# Patient Record
Sex: Male | Born: 1940 | Race: White | Hispanic: No | Marital: Married | Smoking: Former smoker
Health system: Southern US, Community
[De-identification: ages and names within clinical notes are randomized; demographics above are authoritative.]

## PROBLEM LIST (undated history)

## (undated) DIAGNOSIS — K219 Gastro-esophageal reflux disease without esophagitis: Secondary | ICD-10-CM

## (undated) DIAGNOSIS — I509 Heart failure, unspecified: Secondary | ICD-10-CM

## (undated) DIAGNOSIS — Z7901 Long term (current) use of anticoagulants: Secondary | ICD-10-CM

## (undated) DIAGNOSIS — I4891 Unspecified atrial fibrillation: Secondary | ICD-10-CM

## (undated) DIAGNOSIS — F32A Depression, unspecified: Secondary | ICD-10-CM

## (undated) DIAGNOSIS — F329 Major depressive disorder, single episode, unspecified: Secondary | ICD-10-CM

## (undated) DIAGNOSIS — R011 Cardiac murmur, unspecified: Secondary | ICD-10-CM

## (undated) DIAGNOSIS — I10 Essential (primary) hypertension: Secondary | ICD-10-CM

## (undated) HISTORY — PX: EYE SURGERY: SHX253

## (undated) HISTORY — PX: VASECTOMY: SHX75

## (undated) HISTORY — PX: CARDIAC DEFIBRILLATOR PLACEMENT: SHX171

## (undated) HISTORY — PX: INSERT / REPLACE / REMOVE PACEMAKER: SUR710

---

## 2006-01-26 ENCOUNTER — Ambulatory Visit: Payer: Self-pay | Admitting: Cardiology

## 2006-02-08 ENCOUNTER — Ambulatory Visit: Payer: Self-pay | Admitting: Ophthalmology

## 2006-02-15 ENCOUNTER — Ambulatory Visit: Payer: Self-pay | Admitting: Ophthalmology

## 2007-05-14 ENCOUNTER — Ambulatory Visit: Payer: Self-pay | Admitting: Unknown Physician Specialty

## 2009-02-06 ENCOUNTER — Encounter: Payer: Self-pay | Admitting: Urology

## 2009-02-23 ENCOUNTER — Encounter: Payer: Self-pay | Admitting: Urology

## 2009-11-13 ENCOUNTER — Ambulatory Visit: Payer: Self-pay | Admitting: Cardiology

## 2009-11-20 ENCOUNTER — Ambulatory Visit: Payer: Self-pay | Admitting: Cardiology

## 2011-01-21 ENCOUNTER — Ambulatory Visit: Payer: Self-pay | Admitting: Physician Assistant

## 2011-02-25 ENCOUNTER — Encounter (HOSPITAL_COMMUNITY): Payer: Self-pay | Admitting: Respiratory Therapy

## 2011-02-28 ENCOUNTER — Encounter (HOSPITAL_COMMUNITY): Payer: Self-pay

## 2011-02-28 ENCOUNTER — Ambulatory Visit (HOSPITAL_COMMUNITY)
Admission: RE | Admit: 2011-02-28 | Discharge: 2011-02-28 | Disposition: A | Discharge status: Home / Self Care | Payer: Medicare Other | Source: Ambulatory Visit | Attending: Anesthesiology | Admitting: Anesthesiology

## 2011-02-28 ENCOUNTER — Encounter (HOSPITAL_COMMUNITY)
Admission: RE | Admit: 2011-02-28 | Discharge: 2011-02-28 | Disposition: A | Discharge status: Home / Self Care | Payer: Medicare Other | Source: Ambulatory Visit | Attending: Neurosurgery | Admitting: Neurosurgery

## 2011-02-28 DIAGNOSIS — Z01812 Encounter for preprocedural laboratory examination: Secondary | ICD-10-CM | POA: Insufficient documentation

## 2011-02-28 DIAGNOSIS — Z01818 Encounter for other preprocedural examination: Secondary | ICD-10-CM | POA: Insufficient documentation

## 2011-02-28 DIAGNOSIS — Z950 Presence of cardiac pacemaker: Secondary | ICD-10-CM | POA: Insufficient documentation

## 2011-02-28 HISTORY — DX: Major depressive disorder, single episode, unspecified: F32.9

## 2011-02-28 HISTORY — DX: Gastro-esophageal reflux disease without esophagitis: K21.9

## 2011-02-28 HISTORY — DX: Essential (primary) hypertension: I10

## 2011-02-28 HISTORY — DX: Depression, unspecified: F32.A

## 2011-02-28 HISTORY — DX: Heart failure, unspecified: I50.9

## 2011-02-28 HISTORY — DX: Cardiac murmur, unspecified: R01.1

## 2011-02-28 LAB — CBC
HCT: 42.9 % (ref 39.0–52.0)
Hemoglobin: 14.4 g/dL (ref 13.0–17.0)
MCH: 32.7 pg (ref 26.0–34.0)
MCHC: 33.6 g/dL (ref 30.0–36.0)
MCV: 97.5 fL (ref 78.0–100.0)

## 2011-02-28 LAB — BASIC METABOLIC PANEL
BUN: 12 mg/dL (ref 6–23)
Calcium: 9.4 mg/dL (ref 8.4–10.5)
GFR calc non Af Amer: 84 mL/min — ABNORMAL LOW (ref >90)
Glucose, Bld: 103 mg/dL — ABNORMAL HIGH (ref 70–99)

## 2011-02-28 NOTE — Pre-Procedure Instructions (Signed)
18 Evan Sanchez  02/28/2011   Your procedure is scheduled on:  03-04-2011  Report to [facility] at 9:00 AM.  Call this number if you have problems the morning of surgery: [phone]   Remember:   Do not eat food:After Midnight.  Do not drink clear liquids: 4 Hours before arrival.  Take these medicines the morning of surgery with A SIP OF WATER: coreg, digoxin, celexa   Do not wear jewelry, make-up or nail polish.  Do not wear lotions, powders, or perfumes. You may wear deodorant.  Do not shave 48 hours prior to surgery.  Do not bring valuables to the hospital.  Contacts, dentures or bridgework may not be worn into surgery.  Leave suitcase in the car. After surgery it may be brought to your room.  For patients admitted to the hospital, checkout time is 11:00 AM the day of discharge.   Patients discharged the day of surgery will not be allowed to drive home.  Name and phone number of your driver: patricia fogleman - spouse  [phone]  Special Instructions: CHG Shower Use Special Wash: 1/2 bottle night before surgery and 1/2 bottle morning of surgery.   Please read over the following fact sheets that you were given: Pain Booklet, Coughing and Deep Breathing, MRSA Information and Surgical Site Infection Prevention

## 2011-03-03 MED ORDER — VANCOMYCIN HCL 500 MG IV SOLR
500.0000 mg | Freq: Once | INTRAVENOUS | Status: DC
  Filled 2011-03-03: qty 500

## 2011-03-03 MED ORDER — DEXAMETHASONE SODIUM PHOSPHATE 10 MG/ML IJ SOLN: 10.0000 mg | Freq: Once | INTRAMUSCULAR | Status: DC

## 2011-03-03 MED ORDER — TOBRAMYCIN SULFATE 80 MG/2ML IJ SOLN
80.0000 mg | Freq: Once | INTRAVENOUS | Status: DC
  Filled 2011-03-03: qty 2

## 2011-03-03 MED ORDER — DIPHENHYDRAMINE HCL 50 MG/ML IJ SOLN: 50.0000 mg | Freq: Once | INTRAMUSCULAR | Status: DC

## 2011-03-03 NOTE — Progress Notes (Addendum)
Pre op Chart reviewed. Will send to anesthesia to review cardiac history with pacemaker and ICD. ICD form refaxed to [facility] and requested that it be sent back ASAP. Awaiting return call from [facility] to determine date that faxed EKG DOS cardiology does not have one on file within the last year. Repeat PT/ INR order in computer DOS d/t elevation per anesthesia standing orders. Spoke with Waldo Laine at [place] [facility] reports MD did not sign original pacemaker/ ICD order will refax after MD signs order.

## 2011-03-03 NOTE — Consult Note (Signed)
Anesthesia:  Patient is a 70 year old male for lumbar microdiskectomy on 03/04/11.  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  His Cardiologist is Dr. Darrold Junker who did feel patient was low cardiac risk for this procedure.  He had a negative ETT in 2007.  Echocardiogram from 10/07/09 showed mod-sev LV dysfunction, EF 25-30%, mod LV enlargement, mild-mod biatrial enlargement, mild MI, mild-mod TI.  These results are on the chart for the Anesthesiologist to review.  The patient will be getting an EKG DOS, as well as repeat PT/INR.  Dr. Darrold Junker has completed the perioperative device form.

## 2011-03-04 ENCOUNTER — Inpatient Hospital Stay (HOSPITAL_COMMUNITY)
Admission: RE | Admit: 2011-03-04 | Discharge: 2011-03-05 | DRG: 491 | Disposition: A | Discharge status: Home / Self Care | Payer: Medicare Other | Source: Ambulatory Visit | Attending: Neurosurgery | Admitting: Neurosurgery

## 2011-03-04 ENCOUNTER — Encounter (HOSPITAL_COMMUNITY): Payer: Self-pay | Admitting: Vascular Surgery

## 2011-03-04 ENCOUNTER — Other Ambulatory Visit: Payer: Self-pay

## 2011-03-04 ENCOUNTER — Inpatient Hospital Stay (HOSPITAL_COMMUNITY): Payer: Medicare Other | Admitting: Vascular Surgery

## 2011-03-04 ENCOUNTER — Inpatient Hospital Stay (HOSPITAL_COMMUNITY): Payer: Medicare Other

## 2011-03-04 ENCOUNTER — Encounter (HOSPITAL_COMMUNITY)
Admission: RE | Disposition: A | Discharge status: Home / Self Care | Payer: Self-pay | Source: Ambulatory Visit | Attending: Neurosurgery

## 2011-03-04 ENCOUNTER — Encounter (HOSPITAL_COMMUNITY): Payer: Self-pay | Admitting: Certified Registered"

## 2011-03-04 DIAGNOSIS — Z79899 Other long term (current) drug therapy: Secondary | ICD-10-CM

## 2011-03-04 DIAGNOSIS — Z9849 Cataract extraction status, unspecified eye: Secondary | ICD-10-CM

## 2011-03-04 DIAGNOSIS — K219 Gastro-esophageal reflux disease without esophagitis: Secondary | ICD-10-CM | POA: Diagnosis present

## 2011-03-04 DIAGNOSIS — Z7982 Long term (current) use of aspirin: Secondary | ICD-10-CM

## 2011-03-04 DIAGNOSIS — F3289 Other specified depressive episodes: Secondary | ICD-10-CM | POA: Diagnosis present

## 2011-03-04 DIAGNOSIS — Z7901 Long term (current) use of anticoagulants: Secondary | ICD-10-CM

## 2011-03-04 DIAGNOSIS — I10 Essential (primary) hypertension: Secondary | ICD-10-CM | POA: Diagnosis present

## 2011-03-04 DIAGNOSIS — I509 Heart failure, unspecified: Secondary | ICD-10-CM | POA: Diagnosis present

## 2011-03-04 DIAGNOSIS — M5126 Other intervertebral disc displacement, lumbar region: Principal | ICD-10-CM | POA: Diagnosis present

## 2011-03-04 DIAGNOSIS — F17200 Nicotine dependence, unspecified, uncomplicated: Secondary | ICD-10-CM | POA: Diagnosis present

## 2011-03-04 DIAGNOSIS — F329 Major depressive disorder, single episode, unspecified: Secondary | ICD-10-CM | POA: Diagnosis present

## 2011-03-04 HISTORY — PX: LUMBAR LAMINECTOMY/DECOMPRESSION MICRODISCECTOMY: SHX5026

## 2011-03-04 LAB — PROTIME-INR: Prothrombin Time: 14.8 seconds (ref 11.6–15.2)

## 2011-03-04 SURGERY — LUMBAR LAMINECTOMY/DECOMPRESSION MICRODISCECTOMY
Anesthesia: General | Site: Back | Laterality: Left | Wound class: Clean

## 2011-03-04 MED ORDER — PROPOFOL 10 MG/ML IV EMUL
INTRAVENOUS | Status: DC | PRN
  Administered 2011-03-04: 200 mg via INTRAVENOUS

## 2011-03-04 MED ORDER — NEOSTIGMINE METHYLSULFATE 1 MG/ML IJ SOLN
INTRAMUSCULAR | Status: DC | PRN
  Administered 2011-03-04: 4 mg via INTRAVENOUS

## 2011-03-04 MED ORDER — DEXAMETHASONE SODIUM PHOSPHATE 4 MG/ML IJ SOLN
4.0000 mg | Freq: Four times a day (QID) | INTRAMUSCULAR | Status: AC
  Administered 2011-03-05: 4 mg via INTRAVENOUS
  Filled 2011-03-04: qty 1

## 2011-03-04 MED ORDER — ACETAMINOPHEN 650 MG RE SUPP: 650.0000 mg | RECTAL | Status: DC | PRN

## 2011-03-04 MED ORDER — SODIUM CHLORIDE 0.9 % IR SOLN
Status: DC | PRN
  Administered 2011-03-04: 1000 mL

## 2011-03-04 MED ORDER — DIGOXIN 125 MCG PO TABS
125.0000 ug | ORAL_TABLET | Freq: Every day | ORAL | Status: DC
  Administered 2011-03-05: 125 ug via ORAL
  Filled 2011-03-04 (×2): qty 1

## 2011-03-04 MED ORDER — GLYCOPYRROLATE 0.2 MG/ML IJ SOLN
INTRAMUSCULAR | Status: DC | PRN
  Administered 2011-03-04: .4 mg via INTRAVENOUS

## 2011-03-04 MED ORDER — HYDROMORPHONE HCL PF 1 MG/ML IJ SOLN: 0.2500 mg | INTRAMUSCULAR | Status: DC | PRN

## 2011-03-04 MED ORDER — SIMVASTATIN 40 MG PO TABS
40.0000 mg | ORAL_TABLET | Freq: Every day | ORAL | Status: DC
  Administered 2011-03-04: 40 mg via ORAL
  Filled 2011-03-04 (×2): qty 1

## 2011-03-04 MED ORDER — PHENOL 1.4 % MT LIQD: 1.0000 | OROMUCOSAL | Status: DC | PRN

## 2011-03-04 MED ORDER — BUPIVACAINE HCL (PF) 0.25 % IJ SOLN
INTRAMUSCULAR | Status: DC | PRN
  Administered 2011-03-04: 20 mL

## 2011-03-04 MED ORDER — SODIUM CHLORIDE 0.9 % IJ SOLN: 3.0000 mL | INTRAMUSCULAR | Status: DC | PRN

## 2011-03-04 MED ORDER — FENTANYL CITRATE 0.05 MG/ML IJ SOLN
INTRAMUSCULAR | Status: DC | PRN
  Administered 2011-03-04: 50 ug via INTRAVENOUS
  Administered 2011-03-04: 150 ug via INTRAVENOUS
  Administered 2011-03-04 (×2): 50 ug via INTRAVENOUS

## 2011-03-04 MED ORDER — KCL IN DEXTROSE-NACL 20-5-0.45 MEQ/L-%-% IV SOLN
80.0000 mL/h | INTRAVENOUS | Status: DC
  Filled 2011-03-04 (×4): qty 1000

## 2011-03-04 MED ORDER — HYDROCODONE-ACETAMINOPHEN 5-325 MG PO TABS: 1.0000 | ORAL_TABLET | ORAL | Status: DC | PRN

## 2011-03-04 MED ORDER — CYCLOBENZAPRINE HCL 10 MG PO TABS: 10.0000 mg | ORAL_TABLET | Freq: Three times a day (TID) | ORAL | Status: DC | PRN

## 2011-03-04 MED ORDER — HYDROMORPHONE HCL PF 1 MG/ML IJ SOLN: 1.0000 mg | INTRAMUSCULAR | Status: DC | PRN

## 2011-03-04 MED ORDER — ASPIRIN EC 81 MG PO TBEC
81.0000 mg | DELAYED_RELEASE_TABLET | Freq: Every day | ORAL | Status: DC
  Administered 2011-03-04 – 2011-03-05 (×2): 81 mg via ORAL
  Filled 2011-03-04 (×2): qty 1

## 2011-03-04 MED ORDER — GABAPENTIN 300 MG PO CAPS
300.0000 mg | ORAL_CAPSULE | Freq: Every day | ORAL | Status: DC
  Administered 2011-03-04: 300 mg via ORAL
  Filled 2011-03-04 (×2): qty 1

## 2011-03-04 MED ORDER — ACETAMINOPHEN 325 MG PO TABS: 650.0000 mg | ORAL_TABLET | ORAL | Status: DC | PRN

## 2011-03-04 MED ORDER — CITALOPRAM HYDROBROMIDE 20 MG PO TABS
20.0000 mg | ORAL_TABLET | Freq: Every day | ORAL | Status: DC
  Administered 2011-03-04 – 2011-03-05 (×2): 20 mg via ORAL
  Filled 2011-03-04 (×2): qty 1

## 2011-03-04 MED ORDER — DOCUSATE SODIUM 100 MG PO CAPS
100.0000 mg | ORAL_CAPSULE | Freq: Two times a day (BID) | ORAL | Status: DC
  Administered 2011-03-04 – 2011-03-05 (×2): 100 mg via ORAL
  Filled 2011-03-04 (×2): qty 1

## 2011-03-04 MED ORDER — THROMBIN 5000 UNITS EX KIT
PACK | CUTANEOUS | Status: DC | PRN
  Administered 2011-03-04 (×2): 5000 [IU] via TOPICAL

## 2011-03-04 MED ORDER — DEXAMETHASONE SODIUM PHOSPHATE 10 MG/ML IJ SOLN
INTRAMUSCULAR | Status: DC | PRN
  Administered 2011-03-04: 10 mg via INTRAVENOUS

## 2011-03-04 MED ORDER — HEMOSTATIC AGENTS (NO CHARGE) OPTIME
TOPICAL | Status: DC | PRN
  Administered 2011-03-04: 1 via TOPICAL

## 2011-03-04 MED ORDER — LACTATED RINGERS IV SOLN
INTRAVENOUS | Status: DC | PRN
  Administered 2011-03-04 (×3): via INTRAVENOUS

## 2011-03-04 MED ORDER — MENTHOL 3 MG MT LOZG: 1.0000 | LOZENGE | OROMUCOSAL | Status: DC | PRN

## 2011-03-04 MED ORDER — VANCOMYCIN HCL IN DEXTROSE 1-5 GM/200ML-% IV SOLN
1000.0000 mg | Freq: Once | INTRAVENOUS | Status: AC
  Administered 2011-03-04: 1000 mg via INTRAVENOUS
  Filled 2011-03-04: qty 200

## 2011-03-04 MED ORDER — SODIUM CHLORIDE 0.9 % IV SOLN: 250.0000 mL | INTRAVENOUS | Status: DC

## 2011-03-04 MED ORDER — DEXAMETHASONE 4 MG PO TABS
4.0000 mg | ORAL_TABLET | Freq: Four times a day (QID) | ORAL | Status: AC
  Administered 2011-03-04: 4 mg via ORAL
  Filled 2011-03-04: qty 1

## 2011-03-04 MED ORDER — LISINOPRIL 5 MG PO TABS
5.0000 mg | ORAL_TABLET | Freq: Every day | ORAL | Status: DC
  Administered 2011-03-04 – 2011-03-05 (×2): 5 mg via ORAL
  Filled 2011-03-04 (×2): qty 1

## 2011-03-04 MED ORDER — ONDANSETRON HCL 4 MG/2ML IJ SOLN: 4.0000 mg | Freq: Once | INTRAMUSCULAR | Status: DC | PRN

## 2011-03-04 MED ORDER — ONDANSETRON HCL 4 MG/2ML IJ SOLN
INTRAMUSCULAR | Status: DC | PRN
  Administered 2011-03-04: 4 mg via INTRAVENOUS

## 2011-03-04 MED ORDER — CARVEDILOL 25 MG PO TABS
25.0000 mg | ORAL_TABLET | Freq: Two times a day (BID) | ORAL | Status: DC
  Administered 2011-03-04 – 2011-03-05 (×2): 25 mg via ORAL
  Filled 2011-03-04 (×4): qty 1

## 2011-03-04 MED ORDER — ROCURONIUM BROMIDE 100 MG/10ML IV SOLN
INTRAVENOUS | Status: DC | PRN
  Administered 2011-03-04: 50 mg via INTRAVENOUS
  Administered 2011-03-04: 10 mg via INTRAVENOUS

## 2011-03-04 MED ORDER — ZOLPIDEM TARTRATE 5 MG PO TABS: 5.0000 mg | ORAL_TABLET | Freq: Every evening | ORAL | Status: DC | PRN

## 2011-03-04 MED ORDER — ONDANSETRON HCL 4 MG/2ML IJ SOLN: 4.0000 mg | INTRAMUSCULAR | Status: DC | PRN

## 2011-03-04 MED ORDER — DIPHENHYDRAMINE HCL 50 MG/ML IJ SOLN
INTRAMUSCULAR | Status: DC | PRN
  Administered 2011-03-04: 50 mg via INTRAVENOUS

## 2011-03-04 MED ORDER — FUROSEMIDE 20 MG PO TABS
20.0000 mg | ORAL_TABLET | Freq: Every day | ORAL | Status: DC
  Administered 2011-03-05: 20 mg via ORAL
  Filled 2011-03-04: qty 1

## 2011-03-04 MED ORDER — KETOROLAC TROMETHAMINE 15 MG/ML IJ SOLN
INTRAMUSCULAR | Status: DC | PRN
  Administered 2011-03-04: 15 mg via INTRAVENOUS

## 2011-03-04 MED ORDER — KETOROLAC TROMETHAMINE 30 MG/ML IJ SOLN
30.0000 mg | Freq: Four times a day (QID) | INTRAMUSCULAR | Status: AC
  Administered 2011-03-04 – 2011-03-05 (×2): 30 mg via INTRAVENOUS
  Filled 2011-03-04 (×2): qty 1

## 2011-03-04 MED ORDER — BACITRACIN ZINC 500 UNIT/GM EX OINT
TOPICAL_OINTMENT | CUTANEOUS | Status: DC | PRN
  Administered 2011-03-04: 1 via TOPICAL

## 2011-03-04 MED ORDER — SODIUM CHLORIDE 0.9 % IR SOLN
Status: DC | PRN
  Administered 2011-03-04: 11:00:00

## 2011-03-04 SURGICAL SUPPLY — 43 items
7.0 ROUND ×2 IMPLANT
BAG DECANTER FOR FLEXI CONT (MISCELLANEOUS) ×2 IMPLANT
BENZOIN TINCTURE PRP APPL 2/3 (GAUZE/BANDAGES/DRESSINGS) ×2 IMPLANT
BLADE SURG ROTATE 9660 (MISCELLANEOUS) ×2 IMPLANT
BRUSH SCRUB EZ PLAIN DRY (MISCELLANEOUS) ×2 IMPLANT
BUR MATCHSTICK NEURO 3.0X3.8 (BURR) ×2 IMPLANT
CANISTER SUCTION 2500CC (MISCELLANEOUS) ×2 IMPLANT
CLOTH BEACON ORANGE TIMEOUT ST (SAFETY) ×2 IMPLANT
CONT SPEC 4OZ CLIKSEAL STRL BL (MISCELLANEOUS) ×2 IMPLANT
DRAPE LAPAROTOMY 100X72X124 (DRAPES) ×2 IMPLANT
DRAPE MICROSCOPE ZEISS OPMI (DRAPES) ×2 IMPLANT
DRAPE POUCH INSTRU U-SHP 10X18 (DRAPES) ×2 IMPLANT
DRAPE SURG IRRIG POUCH 19X23 (DRAPES) ×4 IMPLANT
DRESSING TELFA 8X3 (GAUZE/BANDAGES/DRESSINGS) ×2 IMPLANT
ELECT REM PT RETURN 9FT ADLT (ELECTROSURGICAL) ×2
ELECTRODE REM PT RTRN 9FT ADLT (ELECTROSURGICAL) ×1 IMPLANT
GAUZE SPONGE 4X4 16PLY XRAY LF (GAUZE/BANDAGES/DRESSINGS) ×2 IMPLANT
GLOVE BIOGEL PI IND STRL 6.5 (GLOVE) ×1 IMPLANT
GLOVE BIOGEL PI INDICATOR 6.5 (GLOVE) ×1
GLOVE ECLIPSE 7.5 STRL STRAW (GLOVE) ×2 IMPLANT
GLOVE ECLIPSE 8.5 STRL (GLOVE) ×2 IMPLANT
GLOVE INDICATOR 6.5 STRL GRN (GLOVE) ×2 IMPLANT
GLOVE OPTIFIT SS 6.5 STRL BRWN (GLOVE) ×2 IMPLANT
GOWN BRE IMP SLV AUR LG STRL (GOWN DISPOSABLE) ×2 IMPLANT
GOWN BRE IMP SLV AUR XL STRL (GOWN DISPOSABLE) ×2 IMPLANT
KIT BASIN OR (CUSTOM PROCEDURE TRAY) ×2 IMPLANT
KIT ROOM TURNOVER OR (KITS) ×2 IMPLANT
NEEDLE HYPO 22GX1.5 SAFETY (NEEDLE) ×2 IMPLANT
NEEDLE SPNL 22GX3.5 QUINCKE BK (NEEDLE) ×2 IMPLANT
NS IRRIG 1000ML POUR BTL (IV SOLUTION) ×2 IMPLANT
PACK LAMINECTOMY NEURO (CUSTOM PROCEDURE TRAY) ×2 IMPLANT
PAD ARMBOARD 7.5X6 YLW CONV (MISCELLANEOUS) ×6 IMPLANT
PATTIES SURGICAL .75X.75 (GAUZE/BANDAGES/DRESSINGS) ×2 IMPLANT
RUBBERBAND STERILE (MISCELLANEOUS) ×4 IMPLANT
SPONGE GAUZE 4X4 12PLY (GAUZE/BANDAGES/DRESSINGS) ×2 IMPLANT
SPONGE SURGIFOAM ABS GEL SZ50 (HEMOSTASIS) ×2 IMPLANT
SUT VIC AB 2-0 OS6 18 (SUTURE) ×6 IMPLANT
SUT VIC AB 3-0 CP2 18 (SUTURE) ×2 IMPLANT
SYR 20ML ECCENTRIC (SYRINGE) ×2 IMPLANT
TOOL FLUTED BALL 7MM (MISCELLANEOUS) IMPLANT
TOWEL OR 17X24 6PK STRL BLUE (TOWEL DISPOSABLE) ×2 IMPLANT
TOWEL OR 17X26 10 PK STRL BLUE (TOWEL DISPOSABLE) ×2 IMPLANT
WATER STERILE IRR 1000ML POUR (IV SOLUTION) ×2 IMPLANT

## 2011-03-04 NOTE — Progress Notes (Signed)
03/04/2011- Call to [employer]-spoke /w Eilene Ghazi- asked to [employer] rep. call [facility]

## 2011-03-04 NOTE — Brief Op Note (Signed)
03/04/2011  12:34 PM  PATIENT:  Evan Sanchez  70 y.o. male  PRE-OPERATIVE DIAGNOSIS:  Herniated Nucleus Pulposus  POST-OPERATIVE DIAGNOSIS:  Herniated Nucleus Pulposus lumbar four-five  PROCEDURE:  Procedure(s): LUMBAR LAMINECTOMY/DECOMPRESSION MICRODISCECTOMY  SURGEON:  Surgeon(s): Wael Maestas O Jeanenne Licea [employer]  PHYSICIAN ASSISTANT:   ASSISTANTS: Pool   ANESTHESIA:   general  EBL:  Total I/O In: 2000 [I.V.:2000] Out: -   BLOOD ADMINISTERED:none  DRAINS: none   LOCAL MEDICATIONS USED:  MARCAINE 20CC  SPECIMEN:  No Specimen  DISPOSITION OF SPECIMEN:  N/A  COUNTS:  YES  TOURNIQUET:  * No tourniquets in log *  DICTATION: .Dragon Dictation  PLAN OF CARE: Admit to inpatient   PATIENT DISPOSITION:  PACU - hemodynamically stable.   Delay start of Pharmacological VTE agent (>24hrs) due to surgical blood loss or risk of bleeding:  {YES/NO/NOT APPLICABLE:20182

## 2011-03-04 NOTE — Anesthesia Postprocedure Evaluation (Signed)
  Anesthesia Post-op Note  Patient: Evan Sanchez  Procedure(s) Performed:  LUMBAR LAMINECTOMY/DECOMPRESSION MICRODISCECTOMY - LEFT Lumbar four-five LUMBAR MICRODISKECTOMY  Patient Location: PACU  Anesthesia Type: General  Level of Consciousness: awake, alert  and oriented  Airway and Oxygen Therapy: Patient Spontanous Breathing and Patient connected to nasal cannula oxygen  Post-op Pain: mild  Post-op Assessment: Post-op Vital signs reviewed, Patient's Cardiovascular Status Stable, Respiratory Function Stable, Patent Airway, No signs of Nausea or vomiting and Pain level controlled  Post-op Vital Signs: stable  Complications: No apparent anesthesia complications

## 2011-03-04 NOTE — Anesthesia Procedure Notes (Signed)
Procedure Name: Intubation Date/Time: 03/04/2011 11:27 AM Performed by: Ellin Goodie Pre-anesthesia Checklist: Patient identified, Emergency Drugs available, Suction available, Patient being monitored and Timeout performed Patient Re-evaluated:Patient Re-evaluated prior to inductionOxygen Delivery Method: Circle System Utilized Preoxygenation: Pre-oxygenation with 100% oxygen Intubation Type: IV induction Ventilation: Mask ventilation without difficulty Laryngoscope Size: Mac and 3 Grade View: Grade I Tube type: Oral Tube size: 7.5 mm Number of attempts: 1 Airway Equipment and Method: stylet Placement Confirmation: ETT inserted through vocal cords under direct vision,  positive ETCO2,  CO2 detector and breath sounds checked- equal and bilateral Secured at: 23 cm Tube secured with: Tape Dental Injury: Teeth and Oropharynx as per pre-operative assessment  Comments: LTA utilized for intubations

## 2011-03-04 NOTE — Op Note (Signed)
Preop diagnosis: Herniated disc L4-5 left Postop diagnosis: Same Procedure: Left L4-5 intralaminar laminotomy for excision of herniated disc with operating microscope Surgeon: Dr.Mearl Olver Assistnat: Dr Jordan Likes  After place the prone position the patient's back was prepped and draped in the usual sterile fashion. Localizing x-ray was taken prior to incision to identify the appropriate level. Midline incision was made about the spinous processes of L4 and L5. Using Bovie cutting current the incision was carried down the spinous processes. Subperiosteal dissection was then carried out on the on the left side of the spinous processes and lamina and self-retaining retractor was placed for exposure. Second x-ray showed approach to the appropriate level. Using the high-speed drill the inferior one half of the L4 lamina and the medial one third of the facet joint were removed. Drill was then used to remove the superior one third of the L5 lamina. The bone and ligamentum flavum removed in a piecemeal fashion. Microscope is draped and brought into the field and used for the remainder of the case. Using microsecond technique the lateral aspect of the thecal sac was identified. Further coagulation was carried out down before the canal to identify the L4-5 disc which was found to be tremendously herniated. After coagulating on the annulus the annulus was incised with a 15 blade. Using pituitary rongeurs and curettes a thorough displaced cleanout was carried out while the same time great care was taken to avoid injury to the neural elements. This was successfully done. Inferior disc material beneath the L5 nerve root was removed as well. At this time inspection was carried out in all directions the evidence of residual compression and none could be identified. Large amounts of irrigation carried out any bleeding was controlled with coagulation and Gelfoam. The wound was then closed in multiple layers of Vicryl on the muscle  fascia subcutaneous and subcuticular tissues. Staples were placed on the skin. A sterile dressing was then applied and the patient was extubated and taken to recovery in stable condition.

## 2011-03-04 NOTE — Anesthesia Preprocedure Evaluation (Addendum)
Anesthesia Evaluation  Patient identified by MRN, date of birth, ID band Patient awake    Reviewed: Allergy & Precautions, H&P , NPO status , Patient's Chart, lab work & pertinent test results, reviewed documented beta blocker date and time   Airway Mallampati: I TM Distance: >3 FB Neck ROM: full    Dental  (+) Missing and Partial Upper   Pulmonary neg pulmonary ROS,    Pulmonary exam normal       Cardiovascular Exercise Tolerance: Good hypertension, Pt. on medications and Pt. on home beta blockers +CHF + Cardiac Defibrillator + Valvular Problems/Murmurs regular Normal    Neuro/Psych PSYCHIATRIC DISORDERS (pt denies) Negative Neurological ROS     GI/Hepatic GERD-  Controlled,  Endo/Other  Negative Endocrine ROS  Renal/GU negative Renal ROS  Genitourinary negative   Musculoskeletal   Abdominal   Peds  Hematology   Anesthesia Other Findings   Reproductive/Obstetrics                          Anesthesia Physical Anesthesia Plan  ASA: III  Anesthesia Plan: General   Post-op Pain Management:    Induction: Intravenous  Airway Management Planned: Oral ETT  Additional Equipment:   Intra-op Plan:   Post-operative Plan: Extubation in OR  Informed Consent: I have reviewed the patients History and Physical, chart, labs and discussed the procedure including the risks, benefits and alternatives for the proposed anesthesia with the patient or authorized representative who has indicated his/her understanding and acceptance.   Dental advisory given  Plan Discussed with: CRNA, Anesthesiologist and Surgeon  Anesthesia Plan Comments:         Anesthesia Quick Evaluation

## 2011-03-04 NOTE — Progress Notes (Signed)
03/04/2011, call back from [place], rep. /w [employer], reported pt. & surgery event to take place today.

## 2011-03-04 NOTE — Progress Notes (Addendum)
PT'S AICD HAS BEEN INTEROGATED AND SENT TO [employer] VIA [facility] (PHONE).  1328: [employer] REP (TARA SHANNON) CALLED AND STATES RESULTS FOR HERE ARE WNL AND THAT SHE WILL ALSO FAX TO HIS CARDIOLOGIST.

## 2011-03-04 NOTE — H&P (Signed)
Evan Sanchez is an 70 y.o. male.   Chief Complaint: Left leg pain HPI: Mr. Ground is a 70 year old gentleman with left lower extremity pain. He was shot right conservative therapies without improvement. He underwent imaging studies which showed a disc abnormality at L5-S1 on the left. After discussing the options he requested surgical intervention. He is now brought to the hospital for an L5-S1 microdiscectomy on the left.  Past Medical History  Diagnosis Date  . Heart murmur     as a child  . CHF (congestive heart failure)      put on coumadin after had chf 3-4 yrs ago  . GERD (gastroesophageal reflux disease)     h/o acid reflux  . Depression   . Hypertension     takes lisinopril - requested ekg, echo, stress tes t from [facility] clinic    Past Surgical History  Procedure Date  . Eye surgery     right cataract removal  . Vasectomy   . Cardiac defibrillator placement ? 2010    No family history on file. Social History:  reports that he quit smoking about 35 years ago. His smoking use included Cigarettes. He has a 50 pack-year smoking history. He has never used smokeless tobacco. He reports that he drinks about 2.4 ounces of alcohol per week. He reports that he does not use illicit drugs.  Allergies: No Known Allergies  Medications Prior to Admission  Medication Dose Route Frequency Provider Last Rate Last Dose  . dexamethasone (DECADRON) injection 10 mg  10 mg Intravenous Once [employer]      . diphenhydrAMINE (BENADRYL) injection 50 mg  50 mg Intravenous Once [employer]      . tobramycin (NEBCIN) 80 mg in dextrose 5 % 50 mL IVPB  80 mg Intravenous Once [employer]      . vancomycin (VANCOCIN) 500 mg in sodium chloride 0.9 % 100 mL IVPB  500 mg Intravenous Once Rolanda Lundborg Kathline Banbury       Medications Prior to Admission  Medication Sig Dispense Refill  . aspirin EC 81 MG tablet Take 81 mg by mouth daily.        . carvedilol (COREG) 25 MG tablet Take 25 mg by  mouth 2 (two) times daily with a meal.        . citalopram (CELEXA) 20 MG tablet Take 20 mg by mouth daily.        . digoxin (LANOXIN) 0.125 MG tablet Take 125 mcg by mouth daily.        . furosemide (LASIX) 20 MG tablet Take 20 mg by mouth daily.        Marland Kitchen gabapentin (NEURONTIN) 300 MG capsule Take 300 mg by mouth at bedtime.        Marland Kitchen HYDROcodone-acetaminophen (NORCO) 5-325 MG per tablet Take 1 tablet by mouth every 6 (six) hours as needed. For pain        . lisinopril (PRINIVIL,ZESTRIL) 5 MG tablet Take 5 mg by mouth daily.        . Multiple Vitamins-Minerals (MULTIVITAMINS THER. W/MINERALS) TABS Take 1 tablet by mouth daily.        . Omega-3 Fatty Acids (FISH OIL) 1200 MG CAPS Take 1,200 mg by mouth daily.        . simvastatin (ZOCOR) 40 MG tablet Take 40 mg by mouth at bedtime.        Marland Kitchen warfarin (COUMADIN) 3 MG tablet Take 3 mg by mouth daily.  No results found for this or any previous visit (from the past 48 hour(s)). No results found.  Pertinent items are noted in HPI.  Blood pressure 132/82, pulse 88, temperature 98.4 F (36.9 C), temperature source Oral, resp. rate 18, SpO2 98.00%.  Neurologic: Reflexes: 2+ and symmetric Achilles reflex (L-5 to S-2) left 0/4 Assessment/Plan Assessment is a herniated disc at L5-S1 on the left. Plan is for a left L5-S1 microdiscectomy.  Reinaldo Meeker, MD 03/04/2011, 9:53 AM

## 2011-03-04 NOTE — Transfer of Care (Signed)
Immediate Anesthesia Transfer of Care Note  Patient: Evan Sanchez  Procedure(s) Performed:  LUMBAR LAMINECTOMY/DECOMPRESSION MICRODISCECTOMY - LEFT Lumbar four-five LUMBAR MICRODISKECTOMY  Patient Location: PACU  Anesthesia Type: General  Level of Consciousness: awake  Airway & Oxygen Therapy: Patient Spontanous Breathing  Post-op Assessment: Report given to PACU RN  Post vital signs: stable  Complications: No apparent anesthesia complications

## 2011-03-04 NOTE — Progress Notes (Signed)
Pharmacy - Vancomycin  1 dose of vancomycin given post-op - 1 Gram IV x 1 at 8 pm  Thank you.

## 2011-03-05 NOTE — Discharge Summary (Addendum)
  Physician Discharge Summary  Patient ID: Evan Sanchez MRN: [id number] DOB/AGE: January 29, 1941 70 y.o.  Admit date: 03/04/2011 Discharge date: 03/05/2011  Admission Diagnoses:  Discharge Diagnoses:  Active Problems:  * No active hospital problems. *    Discharged Condition: good  Hospital Course: The patient was admitted on 03/04/2011 and taken to the operating room where the patient underwent laminectomy. The patient tolerated the procedure well and was taken to the recovery room and then to the floor in stable condition. The hospital course was routine. There were no complications. The wound remained clean dry and intact. The patient remained afebrile with stable vital signs, and tolerated a regular diet. The patient continued to increase activities, and pain was well controlled with oral pain medications.   Consults: none  Significant Diagnostic Studies: none  Treatments: laminectomy  Discharge Exam: Blood pressure 121/78, pulse 75, temperature 97.9 F (36.6 C), temperature source Oral, resp. rate 14, SpO2 95.00%.  Neurologically normal except old foot drop   Disposition: home   Current Discharge Medication List    CONTINUE these medications which have NOT CHANGED   Details  aspirin EC 81 MG tablet Take 81 mg by mouth daily.      carvedilol (COREG) 25 MG tablet Take 25 mg by mouth 2 (two) times daily with a meal.      citalopram (CELEXA) 20 MG tablet Take 20 mg by mouth daily.      digoxin (LANOXIN) 0.125 MG tablet Take 125 mcg by mouth daily.      furosemide (LASIX) 20 MG tablet Take 20 mg by mouth daily.      gabapentin (NEURONTIN) 300 MG capsule Take 300 mg by mouth at bedtime.      HYDROcodone-acetaminophen (NORCO) 5-325 MG per tablet Take 1 tablet by mouth every 6 (six) hours as needed. For pain      lisinopril (PRINIVIL,ZESTRIL) 5 MG tablet Take 5 mg by mouth daily.      Multiple Vitamins-Minerals (MULTIVITAMINS THER. W/MINERALS) TABS Take 1 tablet by  mouth daily.      Omega-3 Fatty Acids (FISH OIL) 1200 MG CAPS Take 1,200 mg by mouth daily.      simvastatin (ZOCOR) 40 MG tablet Take 40 mg by mouth at bedtime.      warfarin (COUMADIN) 3 MG tablet Take 3 mg by mouth daily.         Final Dx: Evan Sanchez  Signed: Shwanda Soltis S 03/05/2011, 8:13 AM

## 2011-03-23 ENCOUNTER — Encounter (HOSPITAL_COMMUNITY): Payer: Self-pay | Admitting: Neurosurgery

## 2011-09-09 IMAGING — CR DG CHEST 2V
1 series · 2 of 2 positions shown · non-contrast
Comparison: none

REASON FOR EXAM: sob,occ fatigue
COMMENTS:

[Series 1: view not recorded · 0.17mm/px · 2 of 2 slices shown]
[im 1/2]
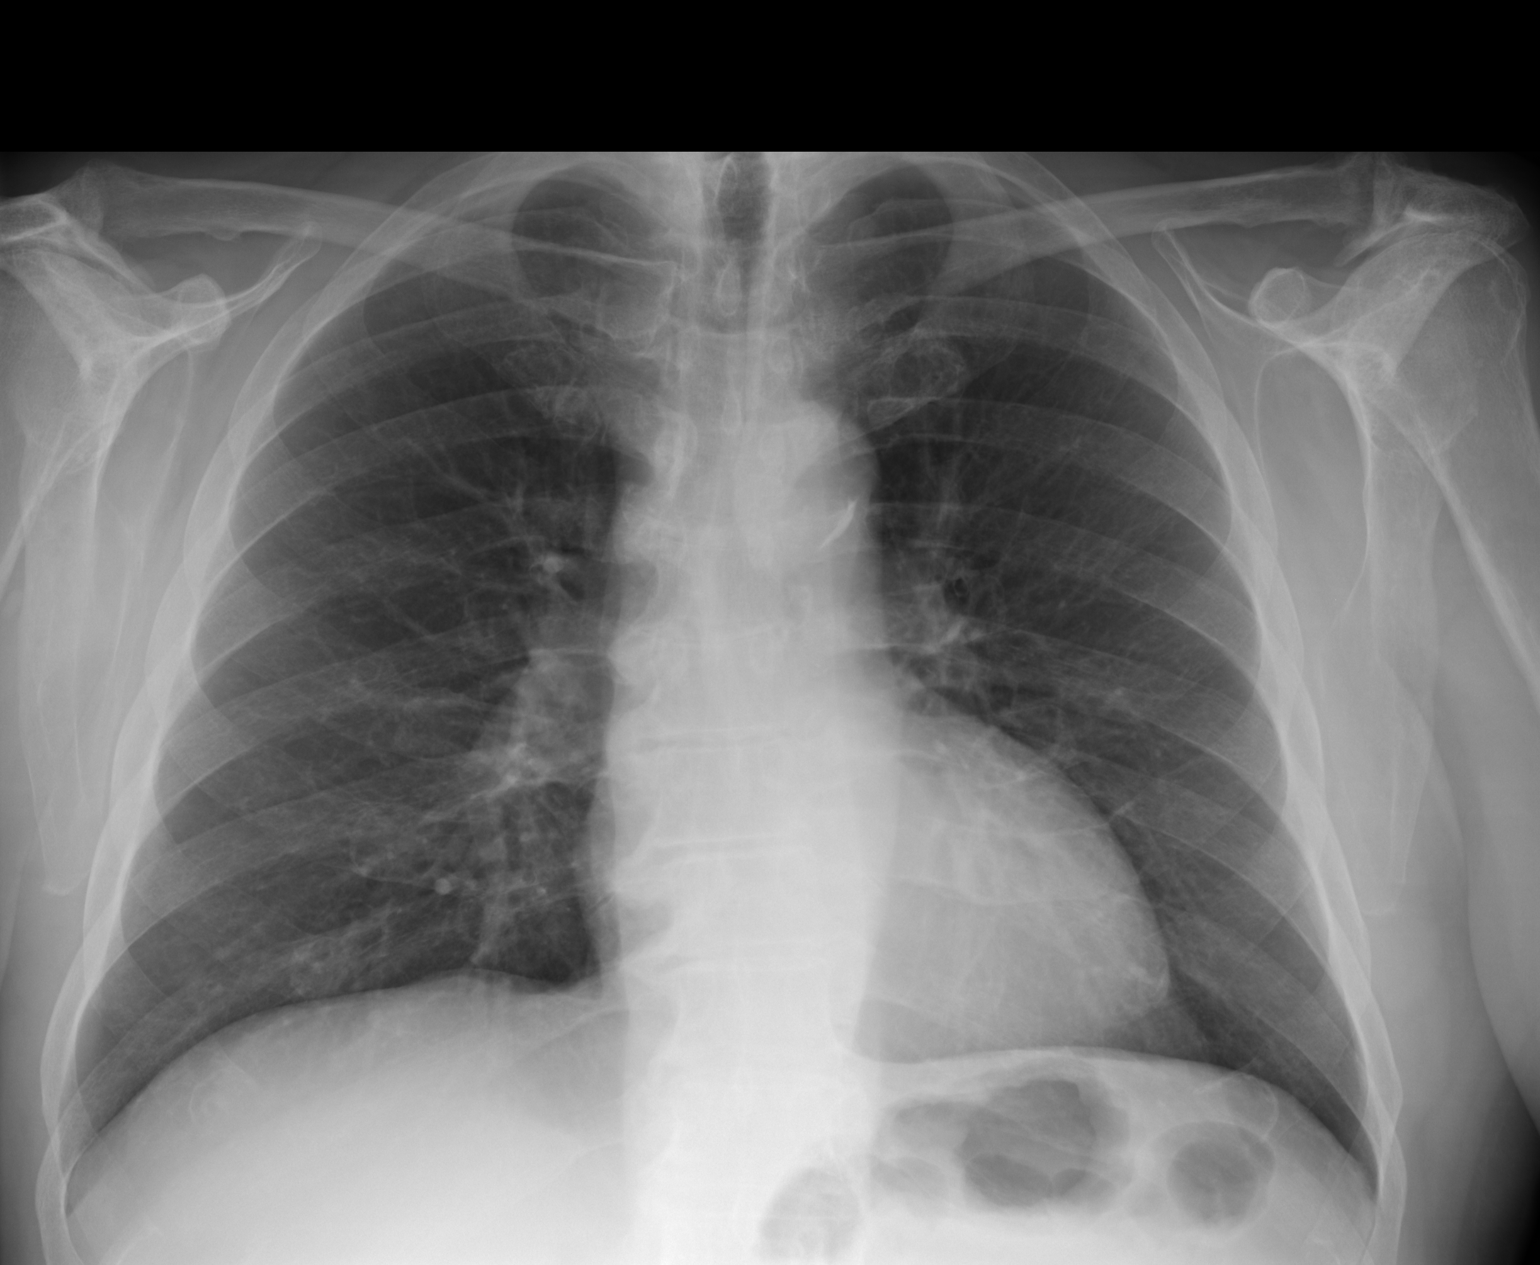
[im 2/2]
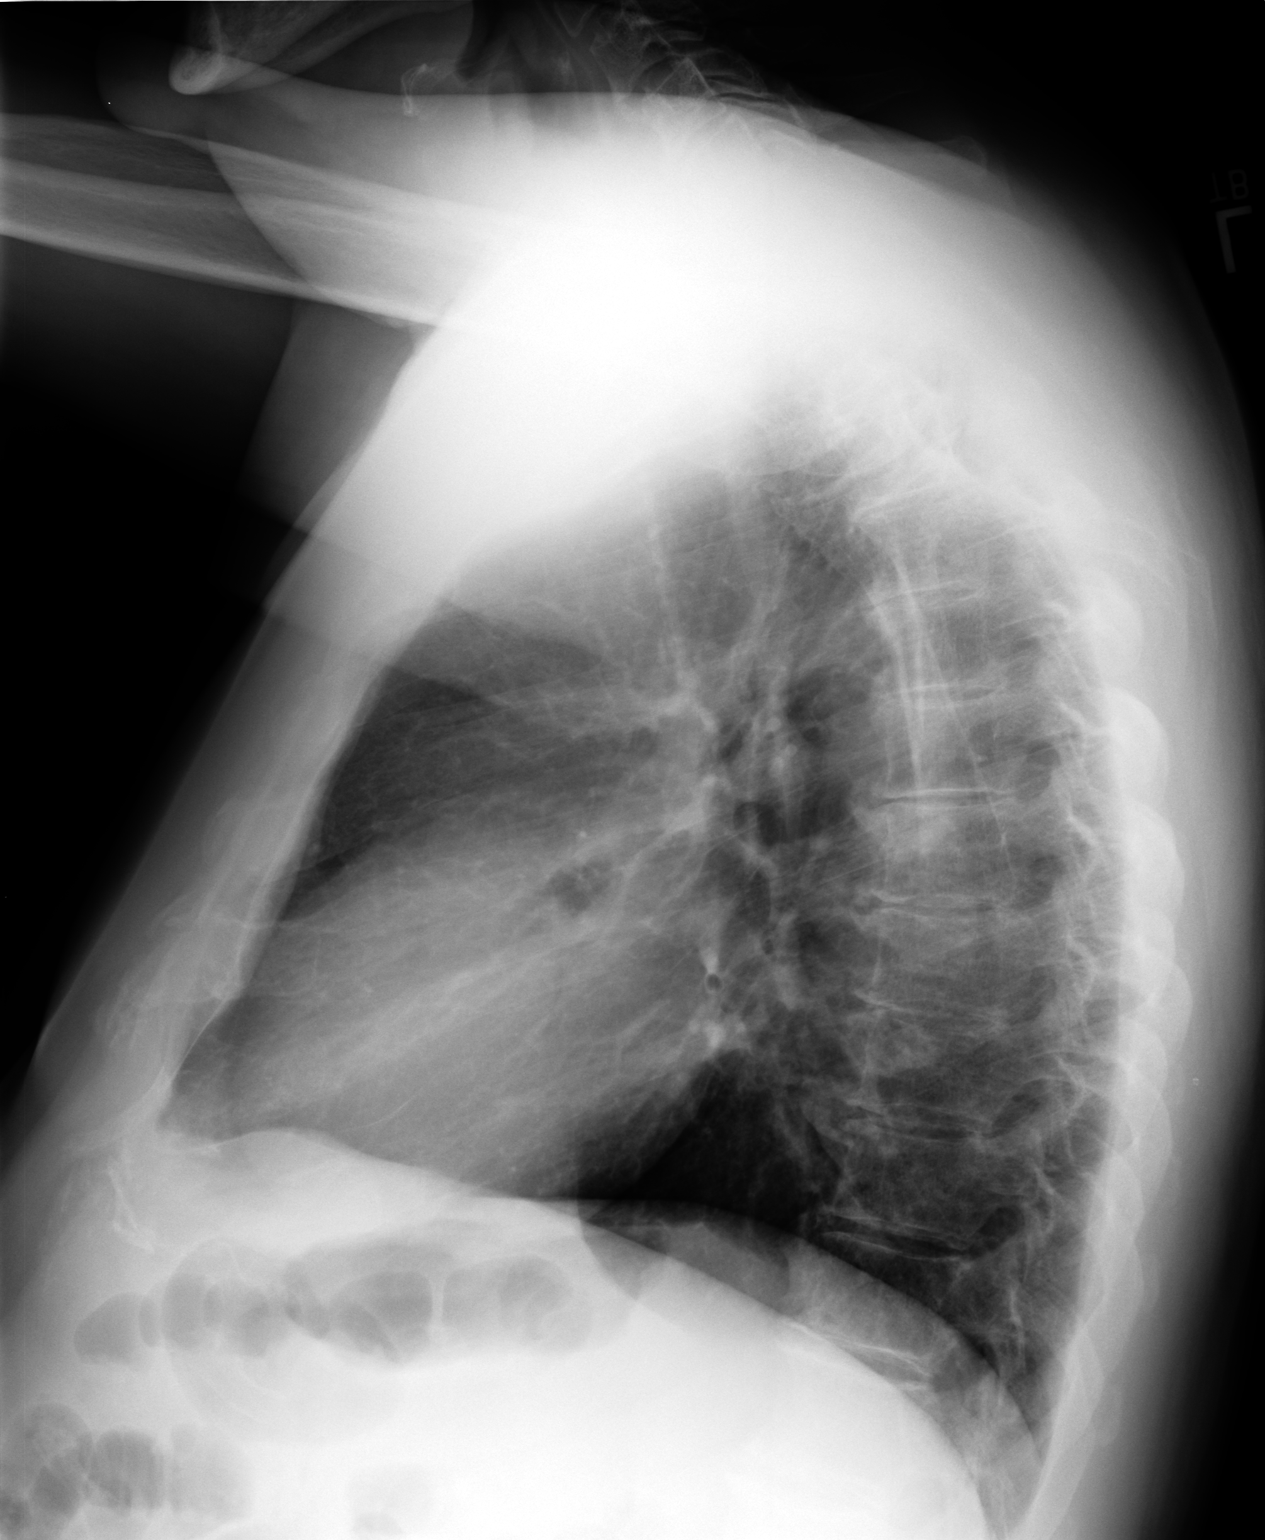

[2 of 2 positions shown; findings below may reference images not displayed]

PROCEDURE:     DXR - DXR CHEST PA (OR AP) AND LATERAL  - November 13, 2009  [DATE]

RESULT:     The lungs are adequately inflated. There is no focal infiltrate.
The cardiac silhouette is normal in size. The pulmonary vascularity is not
engorged. There is no pleural effusion. Mild degenerative disc change of the
thoracic spine is present.
IMPRESSION: I do not see evidence of acute cardiopulmonary abnormality.

## 2015-08-07 ENCOUNTER — Encounter: Payer: Self-pay | Admitting: Emergency Medicine

## 2015-08-07 ENCOUNTER — Emergency Department: Payer: PPO

## 2015-08-07 ENCOUNTER — Emergency Department
Admission: EM | Admit: 2015-08-07 | Discharge: 2015-08-07 | Disposition: A | Discharge status: Home / Self Care | Payer: PPO | Attending: Emergency Medicine | Admitting: Emergency Medicine

## 2015-08-07 DIAGNOSIS — Z95810 Presence of automatic (implantable) cardiac defibrillator: Secondary | ICD-10-CM | POA: Diagnosis not present

## 2015-08-07 DIAGNOSIS — Y999 Unspecified external cause status: Secondary | ICD-10-CM | POA: Diagnosis not present

## 2015-08-07 DIAGNOSIS — Y92009 Unspecified place in unspecified non-institutional (private) residence as the place of occurrence of the external cause: Secondary | ICD-10-CM | POA: Diagnosis not present

## 2015-08-07 DIAGNOSIS — I509 Heart failure, unspecified: Secondary | ICD-10-CM | POA: Insufficient documentation

## 2015-08-07 DIAGNOSIS — Z7982 Long term (current) use of aspirin: Secondary | ICD-10-CM | POA: Diagnosis not present

## 2015-08-07 DIAGNOSIS — S01112A Laceration without foreign body of left eyelid and periocular area, initial encounter: Secondary | ICD-10-CM | POA: Diagnosis not present

## 2015-08-07 DIAGNOSIS — Z87891 Personal history of nicotine dependence: Secondary | ICD-10-CM | POA: Diagnosis not present

## 2015-08-07 DIAGNOSIS — M25562 Pain in left knee: Secondary | ICD-10-CM | POA: Diagnosis not present

## 2015-08-07 DIAGNOSIS — S4992XA Unspecified injury of left shoulder and upper arm, initial encounter: Secondary | ICD-10-CM | POA: Diagnosis not present

## 2015-08-07 DIAGNOSIS — S8002XA Contusion of left knee, initial encounter: Secondary | ICD-10-CM | POA: Diagnosis not present

## 2015-08-07 DIAGNOSIS — S40012A Contusion of left shoulder, initial encounter: Secondary | ICD-10-CM | POA: Diagnosis not present

## 2015-08-07 DIAGNOSIS — Z79899 Other long term (current) drug therapy: Secondary | ICD-10-CM | POA: Diagnosis not present

## 2015-08-07 DIAGNOSIS — Z8679 Personal history of other diseases of the circulatory system: Secondary | ICD-10-CM | POA: Diagnosis not present

## 2015-08-07 DIAGNOSIS — M25561 Pain in right knee: Secondary | ICD-10-CM | POA: Diagnosis not present

## 2015-08-07 DIAGNOSIS — Z7901 Long term (current) use of anticoagulants: Secondary | ICD-10-CM | POA: Insufficient documentation

## 2015-08-07 DIAGNOSIS — I110 Hypertensive heart disease with heart failure: Secondary | ICD-10-CM | POA: Diagnosis not present

## 2015-08-07 DIAGNOSIS — F329 Major depressive disorder, single episode, unspecified: Secondary | ICD-10-CM | POA: Diagnosis not present

## 2015-08-07 DIAGNOSIS — S80212A Abrasion, left knee, initial encounter: Secondary | ICD-10-CM

## 2015-08-07 DIAGNOSIS — W010XXA Fall on same level from slipping, tripping and stumbling without subsequent striking against object, initial encounter: Secondary | ICD-10-CM | POA: Insufficient documentation

## 2015-08-07 DIAGNOSIS — Y9389 Activity, other specified: Secondary | ICD-10-CM | POA: Insufficient documentation

## 2015-08-07 DIAGNOSIS — S80211A Abrasion, right knee, initial encounter: Secondary | ICD-10-CM | POA: Diagnosis not present

## 2015-08-07 NOTE — ED Notes (Signed)
Pt c/o L head, shoulder and knee pain after mechanical fall today.  Pt has 1/" lac avle L eye and abrasion to L knee.  Pt denies CP,SOB, change in vision, LOC or n/v/d. NAD.

## 2015-08-07 NOTE — ED Provider Notes (Signed)
[facility] Emergency Department Provider Note  ____________________________________________  Time seen: Approximately 1:36 PM  I have reviewed the triage vital signs and the nursing notes.   HISTORY  Chief Complaint Facial Laceration and Knee Pain   HPI Evan Sanchez is a 75 y.o. male is here with laceration to his left eyebrow and an abrasion to his left knee. Patient fell at [employer] when he tripped over a garden hose.Patient states that was no loss of consciousness and he was able to get up without any assistance. He is continued to be ambulatory since. He also believes that he hit his left shoulder when he fell as it is sore. He still continues to have good range of motion. He denies any vision problems, nausea or vomiting. He has remained talkative per his wife. Patient denies use of blood thinners. Patient rates his pain as 6/10.   Past Medical History  Diagnosis Date  . Heart murmur     as a child  . CHF (congestive heart failure) ([place])      put on coumadin after had chf 3-4 yrs ago  . GERD (gastroesophageal reflux disease)     h/o acid reflux  . Depression   . Hypertension     takes lisinopril - requested ekg, echo, stress tes t from [facility]    There are no active problems to display for this patient.   Past Surgical History  Procedure Laterality Date  . Eye surgery      right cataract removal  . Vasectomy    . Cardiac defibrillator placement  ? 2010  . Lumbar laminectomy/decompression microdiscectomy  03/04/2011    Procedure: LUMBAR LAMINECTOMY/DECOMPRESSION MICRODISCECTOMY;  Surgeon: [name];  Location: [place] NEURO ORS;  Service: Neurosurgery;  Laterality: Left;  LEFT Lumbar four-five LUMBAR MICRODISKECTOMY    Current Outpatient Rx  Name  Route  Sig  Dispense  Refill  . aspirin EC 81 MG tablet   Oral   Take 81 mg by mouth daily.           . carvedilol (COREG) 25 MG tablet   Oral   Take 25 mg by  mouth 2 (two) times daily with a meal.           . citalopram (CELEXA) 20 MG tablet   Oral   Take 20 mg by mouth daily.           . digoxin (LANOXIN) 0.125 MG tablet   Oral   Take 125 mcg by mouth daily.           . furosemide (LASIX) 20 MG tablet   Oral   Take 20 mg by mouth daily.           Marland Kitchen gabapentin (NEURONTIN) 300 MG capsule   Oral   Take 300 mg by mouth at bedtime.           Marland Kitchen HYDROcodone-acetaminophen (NORCO) 5-325 MG per tablet   Oral   Take 1 tablet by mouth every 6 (six) hours as needed. For pain           . lisinopril (PRINIVIL,ZESTRIL) 5 MG tablet   Oral   Take 5 mg by mouth daily.           . Multiple Vitamins-Minerals (MULTIVITAMINS THER. W/MINERALS) TABS   Oral   Take 1 tablet by mouth daily.           . Omega-3 Fatty Acids (FISH OIL) 1200 MG CAPS  Oral   Take 1,200 mg by mouth daily.           . simvastatin (ZOCOR) 40 MG tablet   Oral   Take 40 mg by mouth at bedtime.           Marland Kitchen warfarin (COUMADIN) 3 MG tablet   Oral   Take 3 mg by mouth daily.             Allergies Review of patient's allergies indicates no known allergies.  No family history on file.  Social History Social History  Substance Use Topics  . Smoking status: Former Smoker -- 2.00 packs/day for 25 years    Types: Cigarettes    Quit date: 02/28/1976  . Smokeless tobacco: Never Used  . Alcohol Use: 2.4 oz/week    3 Glasses of wine, 1 Cans of beer per week    Review of Systems Constitutional: No fever/chills Eyes: No visual changes. Cardiovascular: Denies chest pain. Respiratory: Denies shortness of breath. Gastrointestinal:   No nausea, no vomiting. Musculoskeletal: Positive for left shoulder pain and left knee pain. Skin: Positive laceration left eyebrow. Positive left knee abrasion. Neurological: Negative for headaches, focal weakness or numbness.  10-point ROS otherwise negative.  ____________________________________________   PHYSICAL  EXAM:  VITAL SIGNS: ED Triage Vitals  Enc Vitals Group     BP 08/07/15 1326 116/72 mmHg     Pulse Rate 08/07/15 1326 86     Resp 08/07/15 1326 16     Temp 08/07/15 1326 98 F (36.7 C)     Temp Source 08/07/15 1326 Oral     SpO2 08/07/15 1326 96 %     Weight 08/07/15 1326 215 lb (97.523 kg)     Height 08/07/15 1326 5\' 11"  (1.803 m)     Head Cir --      Peak Flow --      Pain Score 08/07/15 1326 6     Pain Loc --      Pain Edu? --      Excl. in [place]? --     Constitutional: Alert and oriented. Well appearing and in no acute distress. Eyes: Conjunctivae are normal. PERRL. EOMI. Head: Atraumatic. Nose: No congestion/rhinnorhea. Neck: No stridor.  Palpation of the cervical spine posteriorly there is no point tenderness. Patient range of motion is unrestricted. Cardiovascular: Normal rate, regular rhythm. Grossly normal heart sounds.  Good peripheral circulation. Respiratory: Normal respiratory effort.  No retractions. Lungs CTAB. Musculoskeletal: Anterior left knee there is mild tenderness on palpation. There is no effusion present. Range of motion with crepitus. Range of motion is slow but patient is able to ambulate without assistance. Anterior left shoulder there is no gross deformity but there is tenderness on palpation. No soft tissue swelling. Range of motion is without restriction. Neurologic:  Normal speech and language. No gross focal neurologic deficits are appreciated. No gait instability. Skin:  Skin is warm, dry. There is laceration to the left eyebrow and bleeding is controlled. There is ecchymosis present. No foreign bodies were noted. Left knee there is superficial abrasions anteriorly. No active bleeding is present. Psychiatric: Mood and affect are normal. Speech and behavior are normal.  ____________________________________________   LABS (all labs ordered are listed, but only abnormal results are displayed)  Labs Reviewed - No data to display  RADIOLOGY  Left  knee there is no acute fracture dislocation seen per radiologist. 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 Left shoulder per radiologist shows moderate degenerative changes. No acute fracture dislocation.  ____________________________________________   PROCEDURES  Procedure(s) performed: LACERATION REPAIR Performed by: Johnn Hai Authorized by: Johnn Hai Consent: Verbal consent obtained. Risks and benefits: risks, benefits and alternatives were discussed Consent given by: patient Patient identity confirmed: provided demographic data Prepped and Draped in normal sterile fashion Wound explored  Laceration Location: Left eyebrow  Laceration Length: 2.5 cm  No Foreign Bodies seen or palpated  Anesthesia: local infiltration  Local anesthetic: lidocaine 1 % with epinephrine  Anesthetic total: 4 ml  Irrigation method: syringe Amount of cleaning: standard  Skin closure: 6-0 Ethilon.   Number of sutures: 6   Technique: Simple interrupted   Patient tolerance: Patient tolerated the procedure well with no immediate complications.  Critical Care performed: No  ____________________________________________   INITIAL IMPRESSION / ASSESSMENT AND PLAN / ED COURSE  Pertinent labs & imaging results that were available during my care of the patient were reviewed by me and considered in my medical decision making (see chart for details).  Patient was informed of x-ray results. Patient tolerated last ration repair well. Patient is to take Tylenol as needed for pain and instructions on how to take care of his wound was given to him. He is follow-up with his primary care doctor if any signs of infection or suture removal. ____________________________________________   FINAL CLINICAL IMPRESSION(S) / ED DIAGNOSES  Final diagnoses:  Laceration of left eyebrow without complication, initial encounter  Contusion of knee, left, initial encounter  Abrasion, knee, left,  initial encounter  Contusion of left shoulder, initial encounter      Johnn Hai, PA-C 08/07/15 [place], MD 08/07/15 [phone]

## 2015-08-07 NOTE — Discharge Instructions (Signed)
Facial Laceration A facial laceration is a cut on the face. These injuries can be painful and cause bleeding. Some cuts may need to be closed with stitches (sutures), skin adhesive strips, or wound glue. Cuts usually heal quickly but can leave a scar. It can take 1-2 years for the scar to go away completely. HOME CARE   Only take medicines as told by your doctor.  Follow your doctor's instructions for wound care. For Stitches:  Keep the cut clean and dry.  If you have a bandage (dressing), change it at least once a day. Change the bandage if it gets wet or dirty, or as told by your doctor.  Wash the cut with soap and water 2 times a day. Rinse the cut with water. Pat it dry with a clean towel.  Put a thin layer of medicated cream on the cut as told by your doctor.  You may shower after the first 24 hours. Do not soak the cut in water until the stitches are removed.  Have your stitches removed as told by your doctor.  Do not wear any makeup until a few days after your stitches are removed. For Skin Adhesive Strips:  Keep the cut clean and dry.  Do not get the strips wet. You may take a bath, but be careful to keep the cut dry.  If the cut gets wet, pat it dry with a clean towel.  The strips will fall off on their own. Do not remove the strips that are still stuck to the cut. For Wound Glue:  You may shower or take baths. Do not soak or scrub the cut. Do not swim. Avoid heavy sweating until the glue falls off on its own. After a shower or bath, pat the cut dry with a clean towel.  Do not put medicine or makeup on your cut until the glue falls off.  If you have a bandage, do not put tape over the glue.  Avoid lots of sunlight or tanning lamps until the glue falls off.  The glue will fall off on its own in 5-10 days. Do not pick at the glue. After Healing:  Put sunscreen on the cut for the first year to reduce your scar. GET HELP IF:  You have a fever. GET HELP RIGHT AWAY  IF:   Your cut area gets red, painful, or puffy (swollen).  You see a yellowish-white fluid (pus) coming from the cut.   This information is not intended to replace advice given to you by your health care provider. Make sure you discuss any questions you have with your health care provider.   Document Released: 09/28/2007 Document Revised: 05/02/2014 Document Reviewed: 11/22/2012 Elsevier Interactive Patient Education 2016 [place] to your shoulder and left knee as needed for swelling and pain control. Keep laceration clean and dry. Follow information about wound care. Tylenol if needed. Suture removal at your primary care doctor in 7 days.  WOUND CARE Please return in 7 days to have your stitches/staples removed or sooner if you have concerns.  Keep area clean and dry for 24 hours. Do not remove bandage, if applied.  After 24 hours, remove bandage and wash wound gently with mild soap and warm water. Reapply a new bandage after cleaning wound, if directed.  Continue daily cleansing with soap and water until stitches/staples are removed.  Do not apply any ointments or creams to the wound while stitches/staples are in place, as this may cause delayed  healing.  Notify the office if you experience any of the following signs of infection: Swelling, redness, pus drainage, streaking, fever >101.0 F  Notify the office if you experience excessive bleeding that does not stop after 15-20 minutes of constant, firm pressure.

## 2015-08-07 NOTE — ED Notes (Addendum)
Pt reports tripping on garden hose at [employer]; small laceration/abrasion to left side of forehead, denies use of blood thinners and denies LOC. Pt also reports pain to left knee, some abrasions noted. Pt alert and oriented at this time.

## 2015-09-22 DIAGNOSIS — X32XXXA Exposure to sunlight, initial encounter: Secondary | ICD-10-CM | POA: Diagnosis not present

## 2015-09-22 DIAGNOSIS — D225 Melanocytic nevi of trunk: Secondary | ICD-10-CM | POA: Diagnosis not present

## 2015-09-22 DIAGNOSIS — L570 Actinic keratosis: Secondary | ICD-10-CM | POA: Diagnosis not present

## 2015-09-22 DIAGNOSIS — C4442 Squamous cell carcinoma of skin of scalp and neck: Secondary | ICD-10-CM | POA: Diagnosis not present

## 2015-09-22 DIAGNOSIS — D2262 Melanocytic nevi of left upper limb, including shoulder: Secondary | ICD-10-CM | POA: Diagnosis not present

## 2015-09-22 DIAGNOSIS — D485 Neoplasm of uncertain behavior of skin: Secondary | ICD-10-CM | POA: Diagnosis not present

## 2015-09-22 DIAGNOSIS — D2261 Melanocytic nevi of right upper limb, including shoulder: Secondary | ICD-10-CM | POA: Diagnosis not present

## 2015-11-12 DIAGNOSIS — C4442 Squamous cell carcinoma of skin of scalp and neck: Secondary | ICD-10-CM | POA: Diagnosis not present

## 2015-11-12 DIAGNOSIS — Z85828 Personal history of other malignant neoplasm of skin: Secondary | ICD-10-CM | POA: Diagnosis not present

## 2015-11-22 ENCOUNTER — Emergency Department
Admission: EM | Admit: 2015-11-22 | Discharge: 2015-11-22 | Disposition: A | Discharge status: Home / Self Care | Payer: PPO | Attending: Emergency Medicine | Admitting: Emergency Medicine

## 2015-11-22 ENCOUNTER — Encounter: Payer: Self-pay | Admitting: Emergency Medicine

## 2015-11-22 DIAGNOSIS — I509 Heart failure, unspecified: Secondary | ICD-10-CM | POA: Diagnosis not present

## 2015-11-22 DIAGNOSIS — I110 Hypertensive heart disease with heart failure: Secondary | ICD-10-CM | POA: Diagnosis not present

## 2015-11-22 DIAGNOSIS — Z87891 Personal history of nicotine dependence: Secondary | ICD-10-CM | POA: Diagnosis not present

## 2015-11-22 DIAGNOSIS — Z7982 Long term (current) use of aspirin: Secondary | ICD-10-CM | POA: Diagnosis not present

## 2015-11-22 DIAGNOSIS — M5441 Lumbago with sciatica, right side: Secondary | ICD-10-CM | POA: Diagnosis not present

## 2015-11-22 DIAGNOSIS — I4891 Unspecified atrial fibrillation: Secondary | ICD-10-CM | POA: Insufficient documentation

## 2015-11-22 DIAGNOSIS — Z95810 Presence of automatic (implantable) cardiac defibrillator: Secondary | ICD-10-CM | POA: Diagnosis not present

## 2015-11-22 DIAGNOSIS — Z7901 Long term (current) use of anticoagulants: Secondary | ICD-10-CM | POA: Diagnosis not present

## 2015-11-22 DIAGNOSIS — R55 Syncope and collapse: Secondary | ICD-10-CM | POA: Diagnosis not present

## 2015-11-22 HISTORY — DX: Long term (current) use of anticoagulants: Z79.01

## 2015-11-22 HISTORY — DX: Unspecified atrial fibrillation: I48.91

## 2015-11-22 LAB — BASIC METABOLIC PANEL
Anion gap: 8 (ref 5–15)
BUN: 15 mg/dL (ref 6–20)
CALCIUM: 9.2 mg/dL (ref 8.9–10.3)
CHLORIDE: 105 mmol/L (ref 101–111)
CO2: 23 mmol/L (ref 22–32)
CREATININE: 0.94 mg/dL (ref 0.61–1.24)
GFR calc Af Amer: >60 mL/min (ref >60)
Glucose, Bld: 145 mg/dL — ABNORMAL HIGH (ref 65–99)
Potassium: 4.1 mmol/L (ref 3.5–5.1)
SODIUM: 136 mmol/L (ref 135–145)

## 2015-11-22 LAB — CBC
HCT: 43.4 % (ref 40.0–52.0)
Hemoglobin: 15 g/dL (ref 13.0–18.0)
MCH: 33.4 pg (ref 26.0–34.0)
MCHC: 34.6 g/dL (ref 32.0–36.0)
MCV: 96.3 fL (ref 80.0–100.0)
Platelets: 247 10*3/uL (ref 150–440)
RBC: 4.51 MIL/uL (ref 4.40–5.90)
RDW: 13.6 % (ref 11.5–14.5)
WBC: 9.6 10*3/uL (ref 3.8–10.6)

## 2015-11-22 LAB — URINALYSIS COMPLETE WITH MICROSCOPIC (ARMC ONLY)
BACTERIA UA: NONE SEEN
Bilirubin Urine: NEGATIVE
Glucose, UA: NEGATIVE mg/dL
Ketones, ur: NEGATIVE mg/dL
Leukocytes, UA: NEGATIVE
Nitrite: NEGATIVE
PH: 5 (ref 5.0–8.0)
PROTEIN: NEGATIVE mg/dL
Specific Gravity, Urine: 1.016 (ref 1.005–1.030)

## 2015-11-22 NOTE — ED Triage Notes (Signed)
Pt presents to ED with his wife. Per pt's wife, pt got dizzy and had near syncopal episode at [place] and fell. Pt's wife states that he had to lay on floor and wait until he felt better before he could get up. Pt denies any pain and complaints at this time.

## 2015-11-22 NOTE — ED Provider Notes (Signed)
[facility] Emergency Department Provider Note  ____________________________________________   First MD Initiated Contact with Patient 11/22/15 1555     (approximate)  I have reviewed the triage vital signs and the nursing notes.   HISTORY  Chief Complaint Near Syncope and Fall    HPI Evan Sanchez is a 75 y.o. male with a history of atrial fibrillation with an implanted pacemaker/defibrillator and who takes Xareltopresents by private vehicle for evaluation of a near syncopal episode that he experienced hours ago.  This morning he was at church and was feeling very hot and overheated due to wearing heavy choir robes.  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  As he walked back in he states that his right leg gave out on him and he fell down to the ground.  He did not strike his head and never lost consciousness.  He felt generally weak, hot, flushed, and rested on the concrete for several minutes before he got back up without assistance and finish the rest of the church service.  Once he went home he was still feeling somewhat weak and he told his wife what happened and she insisted that he come to the emergency department. Currently he feels back to his baseline.  He denies having any chest pain, shortness of breath, vomiting, abdominal pain, dysuria, headache.  Although he feels like it was his right leg gave out on him he denies any numbness, tingling, nor weakness in any of his extremities and has not had any difficulty speaking or swallowing. The incident happened acutely and rapidly but there was the prodrome of feeling lightheaded and hot. The initial incident was severe but now it improved on its own and he is back to his baseline.   Past Medical History:  Diagnosis Date  . Atrial fibrillation ([place])   . CHF (congestive heart failure) ([place])     put on coumadin after had chf 3-4  yrs ago  . Current use of long term anticoagulation    Xarelto as of 11/22/2015  . Depression   . GERD (gastroesophageal reflux disease)    h/o acid reflux  . Heart murmur    as a child  . Hypertension    takes lisinopril - requested ekg, echo, stress tes t from [facility]    There are no active problems to display for this patient.   Past Surgical History:  Procedure Laterality Date  . CARDIAC DEFIBRILLATOR PLACEMENT  ? 2010  . CARDIAC DEFIBRILLATOR PLACEMENT    . EYE SURGERY     right cataract removal  . INSERT / REPLACE / REMOVE PACEMAKER    . LUMBAR LAMINECTOMY/DECOMPRESSION MICRODISCECTOMY  03/04/2011   Procedure: LUMBAR LAMINECTOMY/DECOMPRESSION MICRODISCECTOMY;  Surgeon: [name];  Location: [place] NEURO ORS;  Service: Neurosurgery;  Laterality: Left;  LEFT Lumbar four-five LUMBAR MICRODISKECTOMY  . VASECTOMY      Prior to Admission medications   Medication Sig Start Date End Date Taking? Authorizing Provider  aspirin EC 81 MG tablet Take 81 mg by mouth daily.      Historical Provider, MD  carvedilol (COREG) 25 MG tablet Take 25 mg by mouth 2 (two) times daily with a meal.      Historical Provider, MD  citalopram (CELEXA) 20 MG tablet Take 20 mg by mouth daily.      Historical Provider, MD  digoxin (LANOXIN) 0.125 MG tablet Take 125  mcg by mouth daily.      Historical Provider, MD  furosemide (LASIX) 20 MG tablet Take 20 mg by mouth daily.      Historical Provider, MD  gabapentin (NEURONTIN) 300 MG capsule Take 300 mg by mouth at bedtime.      Historical Provider, MD  HYDROcodone-acetaminophen (NORCO) 5-325 MG per tablet Take 1 tablet by mouth every 6 (six) hours as needed. For pain      Historical Provider, MD  lisinopril (PRINIVIL,ZESTRIL) 5 MG tablet Take 5 mg by mouth daily.      Historical Provider, MD  Multiple Vitamins-Minerals (MULTIVITAMINS THER. W/MINERALS) TABS Take 1 tablet by mouth daily.      Historical Provider, MD  Omega-3 Fatty Acids (FISH OIL)  1200 MG CAPS Take 1,200 mg by mouth daily.      Historical Provider, MD  simvastatin (ZOCOR) 40 MG tablet Take 40 mg by mouth at bedtime.      Historical Provider, MD  warfarin (COUMADIN) 3 MG tablet Take 3 mg by mouth daily.      Historical Provider, MD    Allergies Review of patient's allergies indicates no known allergies.  History reviewed. No pertinent family history.  Social History Social History  Substance Use Topics  . Smoking status: Former Smoker    Packs/day: 2.00    Years: 25.00    Types: Cigarettes    Quit date: 02/28/1976  . Smokeless tobacco: Never Used  . Alcohol use 2.4 oz/week    3 Glasses of wine, 1 Cans of beer per week    Review of Systems Constitutional: No fever/chills Eyes: No visual changes. ENT: No sore throat. Cardiovascular: Denies chest pain. Respiratory: Denies shortness of breath. Gastrointestinal: No abdominal pain.  Mild nausea, no vomiting.  No diarrhea.  No constipation. Genitourinary: Negative for dysuria. Musculoskeletal: Negative for back pain. Skin: Negative for rash. Neurological: Negative for headaches, focal weakness or numbness. Near syncopal episode hours ago when he was at church this morning  10-point ROS otherwise negative.  ____________________________________________   PHYSICAL EXAM:  VITAL SIGNS: ED Triage Vitals [11/22/15 1433]  Enc Vitals Group     BP (!) 150/91     Pulse Rate 85     Resp 20     Temp 97.9 F (36.6 C)     Temp Source Oral     SpO2 95 %     Weight 210 lb (95.3 kg)     Height 5' 11.5" (1.816 m)     Head Circumference      Peak Flow      Pain Score      Pain Loc      Pain Edu?      Excl. in [place]?     Constitutional: Alert and oriented. Well appearing and in no acute distress. Eyes: Conjunctivae are normal. PERRL. EOMI. Head: Atraumatic. Nose: No congestion/rhinnorhea. Mouth/Throat: Mucous membranes are moist.  Oropharynx non-erythematous. Neck: No stridor.  No meningeal signs.  No  cervical spine tenderness to palpation. Cardiovascular: Normal rate, regular rhythm. Good peripheral circulation. Grossly normal heart sounds.   Respiratory: Normal respiratory effort.  No retractions. Lungs CTAB. Gastrointestinal: Soft and nontender. No distention.  Musculoskeletal: No lower extremity tenderness nor edema. No gross deformities of extremities. Neurologic:  Normal speech and language. No gross focal neurologic deficits are appreciated.  Cranial nerves grossly. Normal ambulation.  Muscle strength equal and appropriate in all 4 limbs. Skin:  Skin is warm, dry and intact. No rash noted. Psychiatric: Mood  and affect are normal. Speech and behavior are normal.  ____________________________________________   LABS (all labs ordered are listed, but only abnormal results are displayed)  Labs Reviewed  BASIC METABOLIC PANEL - Abnormal; Notable for the following:       Result Value   Glucose, Bld 145 (*)    All other components within normal limits  URINALYSIS COMPLETEWITH MICROSCOPIC ([facility] ONLY) - Abnormal; Notable for the following:    Color, Urine YELLOW (*)    APPearance CLEAR (*)    Hgb urine dipstick 1+ (*)    Squamous Epithelial / LPF 0-5 (*)    All other components within normal limits  CBC  CBG MONITORING, ED   ____________________________________________  EKG  ED ECG REPORT I, Reneisha Stilley, the attending physician, personally viewed and interpreted this ECG.  Date: 11/22/2015 EKG Time: 14:44 Rate: 88 Rhythm: atrial fibrillation with controlled rate QRS Axis: normal Intervals: normal ST/T Wave abnormalities: Non-specific ST segment / T-wave changes, but no evidence of acute ischemia. Conduction Disturbances: none Narrative Interpretation: unremarkable  ____________________________________________  RADIOLOGY   No results found.  ____________________________________________   PROCEDURES  Procedure(s) performed:    Procedures   ____________________________________________   INITIAL IMPRESSION / ASSESSMENT AND PLAN / ED COURSE  Pertinent labs & imaging results that were available during my care of the patient were reviewed by me and considered in my medical decision making (see chart for details).  The patient is well-appearing and in no acute distress.  It sounds like he may have had a vasovagal episode.  He never completely lost consciousness.  His basic blood work is normal and his EKG is unremarkable.  He is back to baseline.  A troponin was not orderedin triage andI agreed that it is not helpful in this context; hours have passed since the incident and at no point has he had any chest pain nor shortness of breath nor a pain  Clinical Course  Comment By Time  Still at baseline, feels well except for exacerbation of chronic low back pain and right sided sciatica.  Encouraged use of  home medications and back specialist follow up (pt had prior laminectomy at [facility]).  No neurological deficits at this time.  I gave my usual and customary return precautions. Hinda Kehr, MD 07/30 1648    ____________________________________________  FINAL CLINICAL IMPRESSION(S) / ED DIAGNOSES  Final diagnoses:  Near syncope  Right-sided low back pain with right-sided sciatica     MEDICATIONS GIVEN DURING THIS VISIT:  Medications - No data to display   NEW OUTPATIENT MEDICATIONS STARTED DURING THIS VISIT:  New Prescriptions   No medications on file      Note:  This document was prepared using Dragon voice recognition software and may include unintentional dictation errors.    Hinda Kehr, MD 11/22/15 [phone]

## 2015-11-22 NOTE — Discharge Instructions (Signed)
You have been seen today in the Emergency Department (ED)  for near syncope (almost passing out).  Your workup including labs and EKG show reassuring results.  It is important that you drink plenty of non-alcoholic fluids to make sure you stay hydrated.  Please call your regular doctor as soon as possible to schedule the next available clinic appointment to follow up with him/her regarding your visit to the ED and your symptoms.  Return to the Emergency Department (ED)  if you have any further syncopal episodes (pass out again) or develop ANY chest pain, pressure, tightness, trouble breathing, sudden sweating, or other symptoms that concern you.

## 2015-11-22 NOTE — ED Notes (Signed)
Pt verbalized understanding of discharge instructions. NAD at this time. 

## 2015-11-27 MED ORDER — BACITRACIN ZINC 500 UNIT/GM EX OINT
TOPICAL_OINTMENT | CUTANEOUS | Status: AC
  Filled 2015-11-27: qty 0.9

## 2016-03-01 ENCOUNTER — Ambulatory Visit: Payer: PPO | Admitting: Physical Therapy

## 2016-03-09 ENCOUNTER — Ambulatory Visit: Payer: Non-veteran care

## 2016-03-14 ENCOUNTER — Ambulatory Visit: Payer: Non-veteran care | Admitting: Physical Therapy

## 2016-03-15 ENCOUNTER — Ambulatory Visit: Payer: Non-veteran care | Attending: Internal Medicine

## 2016-03-15 DIAGNOSIS — M6281 Muscle weakness (generalized): Secondary | ICD-10-CM | POA: Diagnosis present

## 2016-03-15 DIAGNOSIS — R262 Difficulty in walking, not elsewhere classified: Secondary | ICD-10-CM | POA: Diagnosis not present

## 2016-03-15 NOTE — Therapy (Signed)
[place] MAIN [facility] SERVICES [address] [place], [place], [address] Phone: [phone]   Fax:  [phone]  Physical Therapy Evaluation  Patient Details  Name: Evan Sanchez MRN: [id number] Date of Birth: 1940-11-05 Referring Provider: Jorge Mandril MD  Encounter Date: 03/15/2016      PT End of Session - 03/15/16 1635    Visit Number 1   Number of Visits 12   Date for PT Re-Evaluation 04/26/16   Authorization Type 1 out of 12 preauth   PT Start Time 0930   PT Stop Time 1025   PT Time Calculation (min) 55 min   Equipment Utilized During Treatment Gait belt   Activity Tolerance Patient tolerated treatment well;No increased pain   Behavior During Therapy WFL for tasks assessed/performed      Past Medical History:  Diagnosis Date  . Atrial fibrillation ([place])   . CHF (congestive heart failure) ([place])     put on coumadin after had chf 3-4 yrs ago  . Current use of long term anticoagulation    Xarelto as of 11/22/2015  . Depression   . GERD (gastroesophageal reflux disease)    h/o acid reflux  . Heart murmur    as a child  . Hypertension    takes lisinopril - requested ekg, echo, stress tes t from [place] clinic    Past Surgical History:  Procedure Laterality Date  . CARDIAC DEFIBRILLATOR PLACEMENT  ? 2010  . CARDIAC DEFIBRILLATOR PLACEMENT    . EYE SURGERY     right cataract removal  . INSERT / REPLACE / REMOVE PACEMAKER    . LUMBAR LAMINECTOMY/DECOMPRESSION MICRODISCECTOMY  03/04/2011   Procedure: LUMBAR LAMINECTOMY/DECOMPRESSION MICRODISCECTOMY;  Surgeon: [name];  Location: [place] [facility]UR[facility] ORS;  Service: Neurosurgery;  Laterality: Left;  LEFT Lumbar four-five LUMBAR MICRODISKECTOMY  . VASECTOMY      There were no vitals filed for this visit.       Subjective Assessment - 03/15/16 0941    Subjective Patient experiencing increased balance difficulties starting ~ 5 years prior. Patient demonstrates increased  difficulties ascending/descending stairs, walking up hills, showering (when bending to wash LEs), transfering in and out of the tub. Patient reports he feels weak and needs to improve strength to better balance when performing stairs/transfers.     Pertinent History Cardiomyopathy, CHF,    Limitations Lifting;Standing;House hold activities   How long can you stand comfortably? As long as he wants   How long can you walk comfortably? 30 min   Patient Stated Goals Improve strength and balance   Currently in Pain? No/denies            [facility] PT Assessment - 03/15/16 0936      Assessment   Medical Diagnosis Balance Difficulties; foot drop   Referring Provider Jorge Mandril MD   Onset Date/Surgical Date 04/25/10   Hand Dominance Right   Next MD Visit unknown   Prior Therapy none     Precautions   Precautions Fall     Restrictions   Weight Bearing Restrictions No     Balance Screen   Has the patient fallen in the past 6 months Yes   How many times? 1   Has the patient had a decrease in activity level because of a fear of falling?  No   Is the patient reluctant to leave their home because of a fear of falling?  No     Home Ec[place] residence  Living Arrangements Spouse/significant other   Available Help at Discharge Family   Type of [place] to enter   Entrance Stairs-Number of Steps 4   Entrance Stairs-Rails Left   Home Layout Two level   Alternate Level Stairs-Number of Steps 10   Alternate Level Stairs-Rails Can reach both   Home Equipment None     Prior Function   Level of Independence Independent   Vocation Part time employment   Vocation Requirements Standing, lifting, bending   [place], golf, lawn duties     Cognition   Overall Cognitive Status Within Functional Limits for tasks assessed     ROM / Strength   AROM / PROM / Strength Strength     Strength   Strength Assessment Site  Hip;Knee;Ankle   Right/Left Hip Right;Left   Right Hip Flexion 4/5   Right Hip ABduction 4/5   Right Hip ADduction 4/5   Left Hip Flexion 4/5   Left Hip ABduction 4/5   Left Hip ADduction 4/5   Right/Left Knee Right;Left   Right Knee Flexion 4+/5   Right Knee Extension 4+/5   Left Knee Flexion 4+/5   Left Knee Extension 4+/5   Right/Left Ankle Right;Left   Right Ankle Dorsiflexion 4-/5   Right Ankle Plantar Flexion 3+/5   Right Ankle Inversion 4/5   Right Ankle Eversion 4/5   Left Ankle Dorsiflexion 4-/5   Left Ankle Plantar Flexion 4-/5   Left Ankle Inversion 4/5   Left Ankle Eversion 4/5     Ambulation/Gait   Stairs Yes   Stairs Assistance 6: Modified independent (Device/Increase time)   Number of Stairs 8   Gait Comments Decreased stepping pattern on the R versus L. When performing stairs: Step through gait pattern with UE support,  step to pattern when descending stairs      Standardized Balance Assessment   Five times sit to stand comments  27   10 Meter Walk 1.11m/s     Berg Balance Test   Sit to Stand Able to stand  independently using hands   Standing Unsupported Able to stand safely 2 minutes   Sitting with Back Unsupported but Feet Supported on Floor or Stool Able to sit safely and securely 2 minutes   Stand to Sit Controls descent by using hands   Transfers Able to transfer safely, definite need of hands   Standing Unsupported with Eyes Closed Able to stand 10 seconds with supervision   Standing Ubsupported with Feet Together Able to place feet together independently and stand for 1 minute with supervision   From Standing, Reach Forward with Outstretched Arm Can reach forward >5 cm safely (2")   From Standing Position, Pick up Object from [place] to pick up shoe safely and easily   From Standing Position, Turn to Look Behind Over each Shoulder Turn sideways only but maintains balance   Turn 360 Degrees Able to turn 360 degrees safely in 4 seconds or less    Standing Unsupported, Alternately Place Feet on Step/Stool Able to complete 4 steps without aid or supervision   Standing Unsupported, One Foot in Front Able to take small step independently and hold 30 seconds   Standing on One Leg Tries to lift leg/unable to hold 3 seconds but remains standing independently   Total Score 40     Timed Up and Go Test   Normal TUG (seconds) 16.4      TREATMENT: Therapeutic Exercise Single leg balance with  intermittent UE support - 2 x 30sec Unilateral calf raises B  with UE support - 2 x 15 on L; 2 x 10 on R Sit to stands without UE use - 2 x 10 Standing Hip abduction - 2 x 15        PT Education - 03/15/16 1634    Education provided Yes   Education Details HEP: Sit to stands, unilateral calf rasises, Standing hip abduction   Person(s) Educated Patient   Methods Explanation;Demonstration   Comprehension Verbalized understanding;Returned demonstration             PT Long Term Goals - 03/15/16 1641      PT LONG TERM GOAL #1   Title Pt will improve TUG score by 5 seconds to demonstrate significant improvement in functional mobility and decrease fall risk.    Baseline TUG: 16.4   Time 6   Period Weeks   Status New     PT LONG TERM GOAL #2   Title Patient will improve 5xSTS by 12 seconds to demonstrate significant improvement in functional LE strength and increased ease with transfering out of a chair.   Baseline 5xSTS: 27sec   Time 6   Period Weeks   Status New     PT LONG TERM GOAL #3   Title Patient will improve BERG score by 8 points to demonstrate significant improvement in static balance and decrease fall risk.    Baseline BERG: 40   Time 6   Period Weeks   Status New     PT LONG TERM GOAL #4   Title Patient will be independent with HEP focused on improving balance and strength to continue benefits of therapy after discharge.    Baseline Dependent with cueing and performance of exercises.    Time 6   Period Weeks   Status  New               Plan - 03/15/16 1636    Clinical Impression Statement Pt is a 75 yo right hand dominant male experiencing increased fall risk and history of falls. Patient's increased fall risk is indicated by a decreased TUG, gait speed, 5xSTS, and BERG balance score. Patient demonstrates decreased muscular strength and endurance with functional movements such as performing stairs and sit to stands. Patient will benefit from further skilled therapy aimed at improving limitations to decrease fall risks   Rehab Potential Good   Clinical Impairments Affecting Rehab Potential (+) highly motivated, family support (-) age   PT Frequency 2x / week   PT Duration 6 weeks   PT Treatment/Interventions Therapeutic activities;Therapeutic exercise;Balance training;Stair training;Gait training;Neuromuscular re-education;Patient/family education;Passive range of motion;Cryotherapy   PT Next Visit Plan Advance strengthening and balance interventions   PT Home Exercise Plan sit to stands, hip abduction, calf raises   Consulted and Agree with Plan of Care Patient      Patient will benefit from skilled therapeutic intervention in order to improve the following deficits and impairments:  Decreased coordination, Decreased mobility, Decreased strength, Decreased range of motion, Decreased endurance, Decreased balance, Difficulty walking  Visit Diagnosis: Difficulty in walking, not elsewhere classified  Muscle weakness (generalized)     Problem List There are no active problems to display for this patient.   [name], PT DPT 03/15/2016, 4:49 PM  [place] MAIN [facility] SERVICES [address] [place], [place], [address] Phone: [phone]   Fax:  [phone]  Name: RAHN MILONE MRN: [id number] Date of Birth: [birth year]

## 2016-03-21 ENCOUNTER — Ambulatory Visit: Payer: Non-veteran care

## 2016-03-21 VITALS — BP 147/89

## 2016-03-21 DIAGNOSIS — R262 Difficulty in walking, not elsewhere classified: Secondary | ICD-10-CM

## 2016-03-21 DIAGNOSIS — M6281 Muscle weakness (generalized): Secondary | ICD-10-CM

## 2016-03-21 NOTE — Therapy (Signed)
[place] MAIN [facility] SERVICES [address] [place], [place], [address] Phone: [phone]   Fax:  [phone]  Physical Therapy Treatment  Patient Details  Name: Evan Sanchez MRN: [id number] Date of Birth: Nov 16, 1940 Referring Provider: Jorge Mandril MD  Encounter Date: 03/21/2016      PT End of Session - 03/21/16 1057    Visit Number 2   Number of Visits 12   Date for PT Re-Evaluation 04/26/16   Authorization Type 2 out of 12 preauth   PT Start Time 1031   PT Stop Time 1115   PT Time Calculation (min) 44 min   Equipment Utilized During Treatment Gait belt   Activity Tolerance Patient tolerated treatment well;No increased pain   Behavior During Therapy WFL for tasks assessed/performed      Past Medical History:  Diagnosis Date  . Atrial fibrillation ([place])   . CHF (congestive heart failure) ([place])     put on coumadin after had chf 3-4 yrs ago  . Current use of long term anticoagulation    Xarelto as of 11/22/2015  . Depression   . GERD (gastroesophageal reflux disease)    h/o acid reflux  . Heart murmur    as a child  . Hypertension    takes lisinopril - requested ekg, echo, stress tes t from [facility] clinic    Past Surgical History:  Procedure Laterality Date  . CARDIAC DEFIBRILLATOR PLACEMENT  ? 2010  . CARDIAC DEFIBRILLATOR PLACEMENT    . EYE SURGERY     right cataract removal  . INSERT / REPLACE / REMOVE PACEMAKER    . LUMBAR LAMINECTOMY/DECOMPRESSION MICRODISCECTOMY  03/04/2011   Procedure: LUMBAR LAMINECTOMY/DECOMPRESSION MICRODISCECTOMY;  Surgeon: [name];  Location: [place] NEURO ORS;  Service: Neurosurgery;  Laterality: Left;  LEFT Lumbar four-five LUMBAR MICRODISKECTOMY  . VASECTOMY      Vitals:   03/21/16 1035  BP: (!) 147/89        Subjective Assessment - 03/21/16 1034    Subjective Patient states he's been performing HEP and has no major changes since the previous visit.    Pertinent History  Cardiomyopathy, CHF,    Limitations Lifting;Standing;House hold activities   How long can you stand comfortably? As long as he wants   How long can you walk comfortably? 30 min   Patient Stated Goals Improve strength and balance      TREATMENT: Therapeutic Exercise: Sit to stands with airex pad placed on top of the chair - 2 x 10 Seated ball squeeze/glute squeeze - 2 x 15  Seated Clamshells with GTB - 2 x 20 Seated Leg Press with cueing on speed/control - 2 x 20  #120 Side stepping up and up blue airex pad - 2 x 10  Feet together balance on airex - Head turns up/down, left/right - x 20; EC - 45sec x 2 Hip extension on airex pad with UE support - 2 x 20 Marches on airex pad with UE support - 2 x 20 Semi tandem stance with intermittant UE support - 2 x 45 sec          PT Education - 03/21/16 1053    Education provided Yes   Education Details HEP: clamshells    Person(s) Educated Patient   Methods Explanation;Demonstration   Comprehension Verbalized understanding;Returned demonstration             PT Long Term Goals - 03/15/16 1641      PT LONG TERM GOAL #1  Title Pt will improve TUG score by 5 seconds to demonstrate significant improvement in functional mobility and decrease fall risk.    Baseline TUG: 16.4   Time 6   Period Weeks   Status New     PT LONG TERM GOAL #2   Title Patient will improve 5xSTS by 12 seconds to demonstrate significant improvement in functional LE strength and increased ease with transfering out of a chair.   Baseline 5xSTS: 27sec   Time 6   Period Weeks   Status New     PT LONG TERM GOAL #3   Title Patient will improve BERG score by 8 points to demonstrate significant improvement in static balance and decrease fall risk.    Baseline BERG: 40   Time 6   Period Weeks   Status New     PT LONG TERM GOAL #4   Title Patient will be independent with HEP focused on improving balance and strength to continue benefits of therapy after  discharge.    Baseline Dependent with cueing and performance of exercises.    Time 6   Period Weeks   Status New               Plan - 03/21/16 1107    Clinical Impression Statement Pt demonstrates fatigue with exercise indicating decreased hip and LE muscular strength/endurance. Patient required UE support intermittantly throughout balancing exercises indicating decreased static/dynamic balance and patient will benefit from further skilled therapy to reduce fall risk.    Rehab Potential Good   Clinical Impairments Affecting Rehab Potential (+) highly motivated, family support (-) age   PT Frequency 2x / week   PT Duration 6 weeks   PT Treatment/Interventions Therapeutic activities;Therapeutic exercise;Balance training;Stair training;Gait training;Neuromuscular re-education;Patient/family education;Passive range of motion;Cryotherapy   PT Next Visit Plan Advance strengthening and balance interventions   PT Home Exercise Plan sit to stands, hip abduction, calf raises   Consulted and Agree with Plan of Care Patient      Patient will benefit from skilled therapeutic intervention in order to improve the following deficits and impairments:  Decreased coordination, Decreased mobility, Decreased strength, Decreased range of motion, Decreased endurance, Decreased balance, Difficulty walking  Visit Diagnosis: Muscle weakness (generalized)  Difficulty in walking, not elsewhere classified     Problem List There are no active problems to display for this patient.   [name], PT DPT 03/21/2016, 11:16 AM  [place] MAIN [facility] SERVICES [address] [place], [place], [address] Phone: [phone]   Fax:  [phone]  Name: Evan Sanchez MRN: [id number] Date of Birth: [birth year]

## 2016-03-23 ENCOUNTER — Ambulatory Visit: Payer: Non-veteran care

## 2016-03-23 DIAGNOSIS — M6281 Muscle weakness (generalized): Secondary | ICD-10-CM

## 2016-03-23 DIAGNOSIS — R262 Difficulty in walking, not elsewhere classified: Secondary | ICD-10-CM

## 2016-03-23 NOTE — Therapy (Signed)
[place] MAIN [facility] SERVICES [address] [place], [place], [address] Phone: [phone]   Fax:  [phone]  Physical Therapy Treatment  Patient Details  Name: Evan Sanchez MRN: [id number] Date of Birth: 06-Aug-1940 Referring Provider: Jorge Mandril MD  Encounter Date: 03/23/2016      PT End of Session - 03/23/16 1049    Visit Number 3   Number of Visits 12   Date for PT Re-Evaluation 04/26/16   Authorization Type 3 of 12 preauth   PT Start Time 1020   PT Stop Time 1100   PT Time Calculation (min) 40 min   Equipment Utilized During Treatment Gait belt   Activity Tolerance Patient tolerated treatment well;No increased pain   Behavior During Therapy WFL for tasks assessed/performed      Past Medical History:  Diagnosis Date  . Atrial fibrillation ([place])   . CHF (congestive heart failure) ([place])     put on coumadin after had chf 3-4 yrs ago  . Current use of long term anticoagulation    Xarelto as of 11/22/2015  . Depression   . GERD (gastroesophageal reflux disease)    h/o acid reflux  . Heart murmur    as a child  . Hypertension    takes lisinopril - requested ekg, echo, stress tes t from [facility] clinic    Past Surgical History:  Procedure Laterality Date  . CARDIAC DEFIBRILLATOR PLACEMENT  ? 2010  . CARDIAC DEFIBRILLATOR PLACEMENT    . EYE SURGERY     right cataract removal  . INSERT / REPLACE / REMOVE PACEMAKER    . LUMBAR LAMINECTOMY/DECOMPRESSION MICRODISCECTOMY  03/04/2011   Procedure: LUMBAR LAMINECTOMY/DECOMPRESSION MICRODISCECTOMY;  Surgeon: [name];  Location: [place] NEURO ORS;  Service: Neurosurgery;  Laterality: Left;  LEFT Lumbar four-five LUMBAR MICRODISKECTOMY  . VASECTOMY      There were no vitals filed for this visit.      Subjective Assessment - 03/23/16 1021    Subjective Patient reports increased hip soreness today and reports slight aggravation of symptoms in his knees.    Pertinent  History Cardiomyopathy, CHF,    Limitations Lifting;Standing;House hold activities   How long can you stand comfortably? As long as he wants   How long can you walk comfortably? 30 min   Patient Stated Goals Improve strength and balance   Currently in Pain? No/denies      TREATMENT: Therapeutic Exercise: Sit to stands with airex pad placed on top of the chair - 2 x 15 Seated ball squeeze/glute squeeze - 2 x 15  Tandem amb on airex beam - x6 down and back  Hip extension on airex pad with UE support - 2 x 15 Heel raises off of airex beam - 2 x 20 Mini lunges in the  bars --  2 x 15 SLR in hooklyling - 2 x 10   Bridges in hooklying - 2 x 20  Amb over(4) balance stones with intermittent UE support - x6  Side stepping over balance stones (4) with intermittant UE support - x 10       PT Education - 03/23/16 1044    Education provided Yes   Education Details Educated to decrease AROM to limit knee pain    Person(s) Educated Patient   Methods Explanation;Demonstration   Comprehension Verbalized understanding;Returned demonstration             PT Long Term Goals - 03/15/16 1641      PT LONG  TERM GOAL #1   Title Pt will improve TUG score by 5 seconds to demonstrate significant improvement in functional mobility and decrease fall risk.    Baseline TUG: 16.4   Time 6   Period Weeks   Status New     PT LONG TERM GOAL #2   Title Patient will improve 5xSTS by 12 seconds to demonstrate significant improvement in functional LE strength and increased ease with transfering out of a chair.   Baseline 5xSTS: 27sec   Time 6   Period Weeks   Status New     PT LONG TERM GOAL #3   Title Patient will improve BERG score by 8 points to demonstrate significant improvement in static balance and decrease fall risk.    Baseline BERG: 40   Time 6   Period Weeks   Status New     PT LONG TERM GOAL #4   Title Patient will be independent with HEP focused on improving balance and strength  to continue benefits of therapy after discharge.    Baseline Dependent with cueing and performance of exercises.    Time 6   Period Weeks   Status New               Plan - 03/23/16 1112    Clinical Impression Statement Pt demonstrates increased knee pain today with exercises thus performed exercises in hooklying/ supine to decrease knee pain while improving LE/hip strength and stabilization. Patient continues to demonstrate decreased LE strength and endurance with exercises and will benefit from further skilled therapy to return to prior level of function.    Rehab Potential Good   Clinical Impairments Affecting Rehab Potential (+) highly motivated, family support (-) age   PT Frequency 2x / week   PT Duration 6 weeks   PT Treatment/Interventions Therapeutic activities;Therapeutic exercise;Balance training;Stair training;Gait training;Neuromuscular re-education;Patient/family education;Passive range of motion;Cryotherapy   PT Next Visit Plan Advance strengthening and balance interventions   PT Home Exercise Plan sit to stands, hip abduction, calf raises   Consulted and Agree with Plan of Care Patient      Patient will benefit from skilled therapeutic intervention in order to improve the following deficits and impairments:  Decreased coordination, Decreased mobility, Decreased strength, Decreased range of motion, Decreased endurance, Decreased balance, Difficulty walking  Visit Diagnosis: Muscle weakness (generalized)  Difficulty in walking, not elsewhere classified     Problem List There are no active problems to display for this patient.   [name], PT DPT 03/23/2016, 11:18 AM  [place] MAIN [facility] SERVICES [address] [place], [place], [address] Phone: [phone]   Fax:  [phone]  Name: Evan Sanchez MRN: [id number] Date of Birth: 1940-08-18

## 2016-03-24 DIAGNOSIS — L570 Actinic keratosis: Secondary | ICD-10-CM | POA: Diagnosis not present

## 2016-03-24 DIAGNOSIS — Z85828 Personal history of other malignant neoplasm of skin: Secondary | ICD-10-CM | POA: Diagnosis not present

## 2016-03-24 DIAGNOSIS — D2261 Melanocytic nevi of right upper limb, including shoulder: Secondary | ICD-10-CM | POA: Diagnosis not present

## 2016-03-24 DIAGNOSIS — D2272 Melanocytic nevi of left lower limb, including hip: Secondary | ICD-10-CM | POA: Diagnosis not present

## 2016-03-24 DIAGNOSIS — X32XXXA Exposure to sunlight, initial encounter: Secondary | ICD-10-CM | POA: Diagnosis not present

## 2016-03-24 DIAGNOSIS — D225 Melanocytic nevi of trunk: Secondary | ICD-10-CM | POA: Diagnosis not present

## 2016-03-28 ENCOUNTER — Ambulatory Visit: Payer: Non-veteran care | Attending: Internal Medicine

## 2016-03-28 DIAGNOSIS — M6281 Muscle weakness (generalized): Secondary | ICD-10-CM | POA: Diagnosis not present

## 2016-03-28 DIAGNOSIS — R262 Difficulty in walking, not elsewhere classified: Secondary | ICD-10-CM

## 2016-03-28 NOTE — Therapy (Signed)
[place] MAIN [facility] SERVICES [address] [place], [place], [address] Phone: [phone]   Fax:  [phone]  Physical Therapy Treatment  Patient Details  Name: Evan Sanchez MRN: [id number] Date of Birth: 1941-04-01 Referring Provider: Jorge Mandril MD  Encounter Date: 03/28/2016      PT End of Session - 03/28/16 1054    Visit Number 4   Number of Visits 12   Date for PT Re-Evaluation 04/26/16   Authorization Type 4 of 12 preauth   PT Start Time 1031   PT Stop Time 1100   PT Time Calculation (min) 29 min   Equipment Utilized During Treatment Gait belt   Activity Tolerance Patient tolerated treatment well;No increased pain   Behavior During Therapy WFL for tasks assessed/performed      Past Medical History:  Diagnosis Date  . Atrial fibrillation ([place])   . CHF (congestive heart failure) ([place])     put on coumadin after had chf 3-4 yrs ago  . Current use of long term anticoagulation    Xarelto as of 11/22/2015  . Depression   . GERD (gastroesophageal reflux disease)    h/o acid reflux  . Heart murmur    as a child  . Hypertension    takes lisinopril - requested ekg, echo, stress tes t from [facility] clinic    Past Surgical History:  Procedure Laterality Date  . CARDIAC DEFIBRILLATOR PLACEMENT  ? 2010  . CARDIAC DEFIBRILLATOR PLACEMENT    . EYE SURGERY     right cataract removal  . INSERT / REPLACE / REMOVE PACEMAKER    . LUMBAR LAMINECTOMY/DECOMPRESSION MICRODISCECTOMY  03/04/2011   Procedure: LUMBAR LAMINECTOMY/DECOMPRESSION MICRODISCECTOMY;  Surgeon: [name];  Location: [place] [facility];  Service: Neurosurgery;  Laterality: Left;  LEFT Lumbar four-five LUMBAR MICRODISKECTOMY  . VASECTOMY      There were no vitals filed for this visit.      Subjective Assessment - 03/28/16 1037    Subjective Patient reports no major changes since the previous visit and reports decreased fatigue compared to the previous visit.     Pertinent History Cardiomyopathy, CHF,    Limitations Lifting;Standing;House hold activities   How long can you stand comfortably? As long as he wants   How long can you walk comfortably? 30 min   Patient Stated Goals Improve strength and balance      TREATMENT: Therapeutic Exercise: Sit to stands with cueing on joint positioning- 2 x 10 Seated ball squeeze/glute squeeze - 2 x 20 with 2 # ball Standing hip abduction - 2 x 15  Mini lunges in the  bars --  2 x 15 Heel raises off of half foam - 2 x 20 Tandem amb on half foam - 2 x 60sec Leg Press with cueing on knee and hip positioning - 2 x 20 120#          PT Education - 03/28/16 1053    Education provided Yes   Education Details Educated on form and technique   Person(s) Educated Patient   Methods Explanation;Demonstration   Comprehension Verbalized understanding;Returned demonstration             PT Long Term Goals - 03/15/16 1641      PT LONG TERM GOAL #1   Title Pt will improve TUG score by 5 seconds to demonstrate significant improvement in functional mobility and decrease fall risk.    Baseline TUG: 16.4   Time 6   Period Weeks  Status New     PT LONG TERM GOAL #2   Title Patient will improve 5xSTS by 12 seconds to demonstrate significant improvement in functional LE strength and increased ease with transfering out of a chair.   Baseline 5xSTS: 27sec   Time 6   Period Weeks   Status New     PT LONG TERM GOAL #3   Title Patient will improve BERG score by 8 points to demonstrate significant improvement in static balance and decrease fall risk.    Baseline BERG: 40   Time 6   Period Weeks   Status New     PT LONG TERM GOAL #4   Title Patient will be independent with HEP focused on improving balance and strength to continue benefits of therapy after discharge.    Baseline Dependent with cueing and performance of exercises.    Time 6   Period Weeks   Status New               Plan -  03/28/16 1057    Clinical Impression Statement Performed a shortened session today secondary to patient being 54min late for his apppointment. Continued to focused on improving balance and LE strength to improve ability to perform transfers from sitting to standing. Patient will benefit from further skilled therapy to return to prior level of function.   Rehab Potential Good   Clinical Impairments Affecting Rehab Potential (+) highly motivated, family support (-) age   PT Frequency 2x / week   PT Duration 6 weeks   PT Treatment/Interventions Therapeutic activities;Therapeutic exercise;Balance training;Stair training;Gait training;Neuromuscular re-education;Patient/family education;Passive range of motion;Cryotherapy   PT Next Visit Plan Advance strengthening and balance interventions   PT Home Exercise Plan sit to stands, hip abduction, calf raises   Consulted and Agree with Plan of Care Patient      Patient will benefit from skilled therapeutic intervention in order to improve the following deficits and impairments:  Decreased coordination, Decreased mobility, Decreased strength, Decreased range of motion, Decreased endurance, Decreased balance, Difficulty walking  Visit Diagnosis: Muscle weakness (generalized)  Difficulty in walking, not elsewhere classified     Problem List There are no active problems to display for this patient.   [name], PT DPT 03/28/2016, 12:50 PM  [place] MAIN [facility] SERVICES [address] [place], [place], [address] Phone: [phone]   Fax:  [phone]  Name: Evan Sanchez MRN: [id number] Date of Birth: [birth year]

## 2016-03-30 ENCOUNTER — Ambulatory Visit: Payer: Non-veteran care

## 2016-03-30 VITALS — BP 130/79

## 2016-03-30 DIAGNOSIS — M6281 Muscle weakness (generalized): Secondary | ICD-10-CM | POA: Diagnosis not present

## 2016-03-30 DIAGNOSIS — R262 Difficulty in walking, not elsewhere classified: Secondary | ICD-10-CM

## 2016-03-30 NOTE — Therapy (Signed)
[place] MAIN [facility] SERVICES [address] [place], [place], [address] Phone: [phone]   Fax:  [phone]  Physical Therapy Treatment  Patient Details  Name: Evan Sanchez MRN: [id number] Date of Birth: 26-Feb-1941 Referring Provider: Jorge Mandril MD  Encounter Date: 03/30/2016      PT End of Session - 03/30/16 1100    Visit Number 5   Number of Visits 12   Date for PT Re-Evaluation 04/26/16   Authorization Type 5 of 12 preauth   PT Start Time 1015   PT Stop Time 1100   PT Time Calculation (min) 45 min   Equipment Utilized During Treatment Gait belt   Activity Tolerance Patient tolerated treatment well;No increased pain   Behavior During Therapy WFL for tasks assessed/performed      Past Medical History:  Diagnosis Date  . Atrial fibrillation ([place])   . CHF (congestive heart failure) ([place])     put on coumadin after had chf 3-4 yrs ago  . Current use of long term anticoagulation    Xarelto as of 11/22/2015  . Depression   . GERD (gastroesophageal reflux disease)    h/o acid reflux  . Heart murmur    as a child  . Hypertension    takes lisinopril - requested ekg, echo, stress tes t from [facility] clinic    Past Surgical History:  Procedure Laterality Date  . CARDIAC DEFIBRILLATOR PLACEMENT  ? 2010  . CARDIAC DEFIBRILLATOR PLACEMENT    . EYE SURGERY     right cataract removal  . INSERT / REPLACE / REMOVE PACEMAKER    . LUMBAR LAMINECTOMY/DECOMPRESSION MICRODISCECTOMY  03/04/2011   Procedure: LUMBAR LAMINECTOMY/DECOMPRESSION MICRODISCECTOMY;  Surgeon: [name];  Location: [place] [facility]S;  Service: Neurosurgery;  Laterality: Left;  LEFT Lumbar four-five LUMBAR MICRODISKECTOMY  . VASECTOMY      Vitals:   03/30/16 1021  BP: 130/79        Subjective Assessment - 03/30/16 1021    Subjective Patient reports decreased knee pain today and states no soreness after the previous treatment.   Pertinent History  Cardiomyopathy, CHF,    Limitations Lifting;Standing;House hold activities   How long can you stand comfortably? As long as he wants   How long can you walk comfortably? 30 min   Patient Stated Goals Improve strength and balance   Currently in Pain? No/denies      TREATMENT: Therapeutic Exercise: Sit to stands with cueing on joint positioning- 2 x 10 Seated ball squeeze/glute squeeze - 2 x 20 with 4 # ball Side stepping up and over 6" step - x15  Feet together on airex pad cone taps - 2 x 10 Feet together on airex rotations side to side and up and down - x 15 each direction Leg Press with cueing on knee and hip positioning - 2 x 20 120# Mini squats with UE support on airex --  x 15 Heel raises off of half foam -  x 20 Pre gait on airex with cueing to decrease UE support to work dynamic balance - x 20 B        PT Education - 03/30/16 1100    Education provided Yes   Education Details Educated on HEP performance   Person(s) Educated Patient   Methods Explanation   Comprehension Verbalized understanding             PT Long Term Goals - 03/15/16 1641      PT LONG TERM  GOAL #1   Title Pt will improve TUG score by 5 seconds to demonstrate significant improvement in functional mobility and decrease fall risk.    Baseline TUG: 16.4   Time 6   Period Weeks   Status New     PT LONG TERM GOAL #2   Title Patient will improve 5xSTS by 12 seconds to demonstrate significant improvement in functional LE strength and increased ease with transfering out of a chair.   Baseline 5xSTS: 27sec   Time 6   Period Weeks   Status New     PT LONG TERM GOAL #3   Title Patient will improve BERG score by 8 points to demonstrate significant improvement in static balance and decrease fall risk.    Baseline BERG: 40   Time 6   Period Weeks   Status New     PT LONG TERM GOAL #4   Title Patient will be independent with HEP focused on improving balance and strength to continue benefits of  therapy after discharge.    Baseline Dependent with cueing and performance of exercises.    Time 6   Period Weeks   Status New               Plan - 03/30/16 1305    Clinical Impression Statement Focused on improving balance and quadriceps strength today as patient has difficulty ambulating over compliant surfaces. Patient demonstrates increased postural sway with narrow BOS balancing indicating decreased dynamic/static balance and patient will benefit from further skilled therapy to decrease fall risk.    Rehab Potential Good   Clinical Impairments Affecting Rehab Potential (+) highly motivated, family support (-) age   PT Frequency 2x / week   PT Duration 6 weeks   PT Treatment/Interventions Therapeutic activities;Therapeutic exercise;Balance training;Stair training;Gait training;Neuromuscular re-education;Patient/family education;Passive range of motion;Cryotherapy   PT Next Visit Plan Advance strengthening and balance interventions   PT Home Exercise Plan sit to stands, hip abduction, calf raises   Consulted and Agree with Plan of Care Patient      Patient will benefit from skilled therapeutic intervention in order to improve the following deficits and impairments:  Decreased coordination, Decreased mobility, Decreased strength, Decreased range of motion, Decreased endurance, Decreased balance, Difficulty walking  Visit Diagnosis: Muscle weakness (generalized)  Difficulty in walking, not elsewhere classified       G-Codes - 02-Apr-2016 1153    Functional Assessment Tool Used 5XSTS, BERG, TUG, clinical judgement   Functional Limitation Mobility: Walking and moving around   Mobility: Walking and Moving Around Current Status [id number]) At least 20 percent but less than 40 percent impaired, limited or restricted   Mobility: Walking and Moving Around Goal Status [id number]) At least 1 percent but less than 20 percent impaired, limited or restricted      Problem List There are no  active problems to display for this patient.   [name], PT DPT 03/30/2016, 1:11 PM  [place] MAIN [facility] SERVICES [address] [place], [place], [address] Phone: [phone]   Fax:  [phone]  Name: Evan Sanchez MRN: [id number] Date of Birth: [birth year]

## 2016-04-04 ENCOUNTER — Ambulatory Visit: Payer: Non-veteran care

## 2016-04-04 DIAGNOSIS — R262 Difficulty in walking, not elsewhere classified: Secondary | ICD-10-CM

## 2016-04-04 DIAGNOSIS — M6281 Muscle weakness (generalized): Secondary | ICD-10-CM | POA: Diagnosis not present

## 2016-04-04 NOTE — Therapy (Signed)
[place] MAIN [facility] SERVICES [address] [place], [place], [address] Phone: [phone]   Fax:  [phone]  Physical Therapy Treatment  Patient Details  Name: Evan Sanchez MRN: [id number] Date of Birth: 06/08/1940 Referring Provider: Jorge Mandril MD  Encounter Date: 04/04/2016      PT End of Session - 04/04/16 1040    Visit Number 6   Number of Visits 12   Date for PT Re-Evaluation 04/26/16   Authorization Type 6 of 12 preauth   PT Start Time 1030   PT Stop Time 1115   PT Time Calculation (min) 45 min   Equipment Utilized During Treatment Gait belt   Activity Tolerance Patient tolerated treatment well;No increased pain   Behavior During Therapy WFL for tasks assessed/performed      Past Medical History:  Diagnosis Date  . Atrial fibrillation ([place] Sanchez)   . CHF (congestive heart failure) (Evan Sanchez)     put on coumadin after had chf 3-4 yrs ago  . Current use of long term anticoagulation    Xarelto as of 11/22/2015  . Depression   . GERD (gastroesophageal reflux disease)    h/o acid reflux  . Heart murmur    as a child  . Hypertension    takes lisinopril - requested ekg, echo, stress tes t from [facility] clinic    Past Surgical History:  Procedure Laterality Date  . CARDIAC DEFIBRILLATOR PLACEMENT  ? 2010  . CARDIAC DEFIBRILLATOR PLACEMENT    . EYE SURGERY     right cataract removal  . INSERT / REPLACE / REMOVE PACEMAKER    . LUMBAR LAMINECTOMY/DECOMPRESSION MICRODISCECTOMY  03/04/2011   Procedure: LUMBAR LAMINECTOMY/DECOMPRESSION MICRODISCECTOMY;  Surgeon: Evan Sanchez;  Location: [place] [facility] ORS;  Service: Neurosurgery;  Laterality: Left;  LEFT Lumbar four-five LUMBAR MICRODISKECTOMY  . VASECTOMY      There were no vitals filed for this visit.      Subjective Assessment - 04/04/16 1037    Subjective Patient reports no falls or major changes since the previous treatment.    Pertinent History Cardiomyopathy, CHF,    Limitations Lifting;Standing;House hold activities   How long can you stand comfortably? As long as he wants   How long can you walk comfortably? 30 min   Patient Stated Goals Improve strength and balance   Currently in Pain? No/denies      TREATMENT: Therapeutic Exercise: Sit to stands with cueing on joint positioning- 3 x 10 Seated ball squeeze/glute squeeze - 2 x 20 with 4 # ball Stepping lunges on the dynadisc - x 20 Marches on dyandisc - x20 Tandem stance on dyandisc - 1 min each side Balance on half foam - 45sec x 3  Side stepping up and over 8" step - x15  Leg Press for quadriceps strengthening and calf raisewith cueing on knee and hip positioning - 2 x 25 135# Step ups onto bosu ball - x15 with UE support        PT Education - 04/04/16 1039    Education provided Yes   Education Details Form/Technique throughout exercises   Person(s) Educated Patient   Methods Explanation;Demonstration   Comprehension Verbalized understanding;Returned demonstration             PT Long Term Goals - 03/15/16 1641      PT LONG TERM GOAL #1   Title Pt will improve TUG score by 5 seconds to demonstrate significant improvement in functional mobility and decrease fall risk.  Baseline TUG: 16.4   Time 6   Period Weeks   Status Evan     PT LONG TERM GOAL #2   Title Patient will improve 5xSTS by 12 seconds to demonstrate significant improvement in functional LE strength and increased ease with transfering out of a chair.   Baseline 5xSTS: 27sec   Time 6   Period Weeks   Status Evan     PT LONG TERM GOAL #3   Title Patient will improve BERG score by 8 points to demonstrate significant improvement in static balance and decrease fall risk.    Baseline BERG: 40   Time 6   Period Weeks   Status Evan     PT LONG TERM GOAL #4   Title Patient will be independent with HEP focused on improving balance and strength to continue benefits of therapy after discharge.    Baseline Dependent  with cueing and performance of exercises.    Time 6   Period Weeks   Status Evan               Plan - 04/04/16 1112    Clinical Impression Statement Conitnued to focus on static balance and LE strengthening during therapy session today as patient continues to demonstrate increased time to perform sit to stands and stepping activities. Patient also demonstrates increased postural sway with balancing activities indicating decreased static/dynamic balance and will benefit from further skilled therapy to decrease fall risk.    Rehab Potential Good   Clinical Impairments Affecting Rehab Potential (+) highly motivated, family support (-) age   PT Frequency 2x / week   PT Duration 6 weeks   PT Treatment/Interventions Therapeutic activities;Therapeutic exercise;Balance training;Stair training;Gait training;Neuromuscular re-education;Patient/family education;Passive range of motion;Cryotherapy   PT Next Visit Plan Advance strengthening and balance interventions   PT Home Exercise Plan sit to stands, hip abduction, calf raises   Consulted and Agree with Plan of Care Patient      Patient will benefit from skilled therapeutic intervention in order to improve the following deficits and impairments:  Decreased coordination, Decreased mobility, Decreased strength, Decreased range of motion, Decreased endurance, Decreased balance, Difficulty walking  Visit Diagnosis: Muscle weakness (generalized)  Difficulty in walking, not elsewhere classified     Problem List There are no active problems to display for this patient.   Evan Sanchez, PT DPT 04/04/2016, 2:14 PM  [place] MAIN [facility] SERVICES [address] [place], [place], [address] Phone: [phone]   Fax:  [phone]  Name: Evan Sanchez MRN: [id number] Date of Birth: 10/20/1940

## 2016-04-06 ENCOUNTER — Ambulatory Visit: Payer: Non-veteran care

## 2016-04-11 ENCOUNTER — Ambulatory Visit: Payer: Non-veteran care

## 2016-04-11 DIAGNOSIS — R262 Difficulty in walking, not elsewhere classified: Secondary | ICD-10-CM

## 2016-04-11 DIAGNOSIS — M6281 Muscle weakness (generalized): Secondary | ICD-10-CM

## 2016-04-11 NOTE — Therapy (Signed)
[place] MAIN [facility] SERVICES [address] [place], [place], [address] Phone: [phone]   Fax:  [phone]  Physical Therapy Treatment  Patient Details  Name: Evan Sanchez MRN: [id number] Date of Birth: 01-24-1941 Referring Provider: Jorge Mandril MD  Encounter Date: 04/11/2016      PT End of Session - 04/11/16 1409    Visit Number 7   Number of Visits 12   Date for PT Re-Evaluation 04/26/16   Authorization Type 7 of 12 preauth   PT Start Time [id number]   PT Stop Time 1430   PT Time Calculation (min) 45 min   Equipment Utilized During Treatment Gait belt   Activity Tolerance Patient tolerated treatment well;No increased pain   Behavior During Therapy WFL for tasks assessed/performed      Past Medical History:  Diagnosis Date  . Atrial fibrillation ([place])   . CHF (congestive heart failure) ([place])     put on coumadin after had chf 3-4 yrs ago  . Current use of long term anticoagulation    Xarelto as of 11/22/2015  . Depression   . GERD (gastroesophageal reflux disease)    h/o acid reflux  . Heart murmur    as a child  . Hypertension    takes lisinopril - requested ekg, echo, stress tes t from [facility] clinic    Past Surgical History:  Procedure Laterality Date  . CARDIAC DEFIBRILLATOR PLACEMENT  ? 2010  . CARDIAC DEFIBRILLATOR PLACEMENT    . EYE SURGERY     right cataract removal  . INSERT / REPLACE / REMOVE PACEMAKER    . LUMBAR LAMINECTOMY/DECOMPRESSION MICRODISCECTOMY  03/04/2011   Procedure: LUMBAR LAMINECTOMY/DECOMPRESSION MICRODISCECTOMY;  Surgeon: [name];  Location: [place] [facility];  Service: Neurosurgery;  Laterality: Left;  LEFT Lumbar four-five LUMBAR MICRODISKECTOMY  . VASECTOMY      There were no vitals filed for this visit.      Subjective Assessment - 04/11/16 1407    Subjective Patient reports no falls and no current muscular soreness. Patient mentions slight stiffness in his knees.   Pertinent  History Cardiomyopathy, CHF,    Limitations Lifting;Standing;House hold activities   How long can you stand comfortably? As long as he wants   How long can you walk comfortably? 30 min   Patient Stated Goals Improve strength and balance   Currently in Pain? No/denies      TREATMENT: Therapeutic Exercise: Nustep level 4 - 4 min for warm up and cueing needed for set up  Sit to stands with cueing on joint positioning- 2 x 15 with purple pad on the chair Seated ball squeeze/glute squeeze - 2 x 20 with 4 # ball Step ups onto bosu ball with high knee- x15 with UE support Side stepping up and over bosu - x15  Single leg stance on airex with UE support - 3 x 30sec Stepping lunges on the Bosu ball B - x 20 Tandem amb with cueing on foot placement for dynamic balance -  71ft x 2 Leg Press for quadriceps strengthening and calf raise with cueing on knee and hip positioning - 2 x 20 150#      PT Education - 04/11/16 1408    Education provided Yes   Education Details Form and technique throughout exercise performance   Person(s) Educated Patient   Methods Explanation;Demonstration   Comprehension Verbalized understanding;Returned demonstration             PT Long Term Goals -  03/15/16 1641      PT LONG TERM GOAL #1   Title Pt will improve TUG score by 5 seconds to demonstrate significant improvement in functional mobility and decrease fall risk.    Baseline TUG: 16.4   Time 6   Period Weeks   Status New     PT LONG TERM GOAL #2   Title Patient will improve 5xSTS by 12 seconds to demonstrate significant improvement in functional LE strength and increased ease with transfering out of a chair.   Baseline 5xSTS: 27sec   Time 6   Period Weeks   Status New     PT LONG TERM GOAL #3   Title Patient will improve BERG score by 8 points to demonstrate significant improvement in static balance and decrease fall risk.    Baseline BERG: 40   Time 6   Period Weeks   Status New     PT  LONG TERM GOAL #4   Title Patient will be independent with HEP focused on improving balance and strength to continue benefits of therapy after discharge.    Baseline Dependent with cueing and performance of exercises.    Time 6   Period Weeks   Status New               Plan - 04/11/16 1413    Clinical Impression Statement COnintued to focus on LE strengthening and balance activities to improve fall risk and patient demonstrates ability to perform more reps before fatigue indicating functional carryover between visits. Patient continues to demonstrate decreased strength and endurance and will benefit from further skilled therapy to return to prior level of function.     Rehab Potential Good   Clinical Impairments Affecting Rehab Potential (+) highly motivated, family support (-) age   PT Frequency 2x / week   PT Duration 6 weeks   PT Treatment/Interventions Therapeutic activities;Therapeutic exercise;Balance training;Stair training;Gait training;Neuromuscular re-education;Patient/family education;Passive range of motion;Cryotherapy   PT Next Visit Plan Advance strengthening and balance interventions   PT Home Exercise Plan sit to stands, hip abduction, calf raises   Consulted and Agree with Plan of Care Patient      Patient will benefit from skilled therapeutic intervention in order to improve the following deficits and impairments:  Decreased coordination, Decreased mobility, Decreased strength, Decreased range of motion, Decreased endurance, Decreased balance, Difficulty walking  Visit Diagnosis: Muscle weakness (generalized)  Difficulty in walking, not elsewhere classified     Problem List There are no active problems to display for this patient.   [name], PT DPT 04/11/2016, 2:26 PM  [place] MAIN [facility] SERVICES [address] [place], [place], [address] Phone: [phone]   Fax:  [phone]  Name: Evan Sanchez MRN: [id number] Date of Birth: July 28, 1940

## 2016-04-13 ENCOUNTER — Ambulatory Visit: Payer: Non-veteran care

## 2016-04-13 DIAGNOSIS — M6281 Muscle weakness (generalized): Secondary | ICD-10-CM | POA: Diagnosis not present

## 2016-04-13 DIAGNOSIS — R262 Difficulty in walking, not elsewhere classified: Secondary | ICD-10-CM

## 2016-04-13 NOTE — Therapy (Signed)
[place] MAIN [facility] SERVICES [address] [place], [place], [address] Phone: [phone]   Fax:  [phone]  Physical Therapy Treatment  Patient Details  Name: Evan Sanchez MRN: [id number] Date of Birth: 05-10-1940 Referring Provider: Jorge Mandril MD  Encounter Date: 04/13/2016      PT End of Session - 04/13/16 1043    Visit Number 8   Number of Visits 12   Date for PT Re-Evaluation 04/26/16   Authorization Type 8 of 12 preauth   PT Start Time 1030   PT Stop Time 1115   PT Time Calculation (min) 45 min   Equipment Utilized During Treatment Gait belt   Activity Tolerance Patient tolerated treatment well;No increased pain   Behavior During Therapy WFL for tasks assessed/performed      Past Medical History:  Diagnosis Date  . Atrial fibrillation ([place])   . CHF (congestive heart failure) ([place])     put on coumadin after had chf 3-4 yrs ago  . Current use of long term anticoagulation    Xarelto as of 11/22/2015  . Depression   . GERD (gastroesophageal reflux disease)    h/o acid reflux  . Heart murmur    as a child  . Hypertension    takes lisinopril - requested ekg, echo, stress tes t from [facility] clinic    Past Surgical History:  Procedure Laterality Date  . CARDIAC DEFIBRILLATOR PLACEMENT  ? 2010  . CARDIAC DEFIBRILLATOR PLACEMENT    . EYE SURGERY     right cataract removal  . INSERT / REPLACE / REMOVE PACEMAKER    . LUMBAR LAMINECTOMY/DECOMPRESSION MICRODISCECTOMY  03/04/2011   Procedure: LUMBAR LAMINECTOMY/DECOMPRESSION MICRODISCECTOMY;  Surgeon: [name];  Location: [place] NEURO ORS;  Service: Neurosurgery;  Laterality: Left;  LEFT Lumbar four-five LUMBAR MICRODISKECTOMY  . VASECTOMY      There were no vitals filed for this visit.      Subjective Assessment - 04/13/16 1037    Subjective Patient reports minor soreness in the hips today and states he's "doing alright" right now.    Pertinent History  Cardiomyopathy, CHF,    Limitations Lifting;Standing;House hold activities   How long can you stand comfortably? As long as he wants   How long can you walk comfortably? 30 min   Patient Stated Goals Improve strength and balance   Currently in Pain? No/denies      TREATMENT: Therapeutic Exercise: Standing hip abduction with B UE support - 2 x 15 B Standing hip Extension with B UE support - 2 x 15 B Nustep level 4 - 4 min for warm up and cueing needed for set up  Mini lunges in standing - 2 x 10 Walkouts at [employer] machine - x3 down and back in all directions Leg Press for quadriceps strengthening and calf raise with cueing on knee and hip positioning - 3 x 12 150# Tandem stance on airex pad with rotational body turns - x 10 B Heel raises on half blue foam - 2 x 20  Single leg stance on airex with UE support - 3 x 30sec        PT Education - 04/13/16 1043    Education provided Yes   Education Details Knee positioning with squatting motion   Person(s) Educated Patient   Methods Explanation;Demonstration   Comprehension Verbalized understanding;Returned demonstration             PT Long Term Goals - 03/15/16 1641  PT LONG TERM GOAL #1   Title Pt will improve TUG score by 5 seconds to demonstrate significant improvement in functional mobility and decrease fall risk.    Baseline TUG: 16.4   Time 6   Period Weeks   Status New     PT LONG TERM GOAL #2   Title Patient will improve 5xSTS by 12 seconds to demonstrate significant improvement in functional LE strength and increased ease with transfering out of a chair.   Baseline 5xSTS: 27sec   Time 6   Period Weeks   Status New     PT LONG TERM GOAL #3   Title Patient will improve BERG score by 8 points to demonstrate significant improvement in static balance and decrease fall risk.    Baseline BERG: 40   Time 6   Period Weeks   Status New     PT LONG TERM GOAL #4   Title Patient will be independent with HEP  focused on improving balance and strength to continue benefits of therapy after discharge.    Baseline Dependent with cueing and performance of exercises.    Time 6   Period Weeks   Status New               Plan - 04/13/16 1113    Clinical Impression Statement Performed exercises focused on performing LE strength and balancing activities to decrease fall risk. Patient demonstrates decreased postural sway and less use of UEs to perform balancing activities indicating improvement, but continues to demonstrate LE weakness and will benefit from further skilled therapy to return to prior level of function.    Rehab Potential Good   Clinical Impairments Affecting Rehab Potential (+) highly motivated, family support (-) age   PT Frequency 2x / week   PT Duration 6 weeks   PT Treatment/Interventions Therapeutic activities;Therapeutic exercise;Balance training;Stair training;Gait training;Neuromuscular re-education;Patient/family education;Passive range of motion;Cryotherapy   PT Next Visit Plan Advance strengthening and balance interventions   PT Home Exercise Plan sit to stands, hip abduction, calf raises   Consulted and Agree with Plan of Care Patient      Patient will benefit from skilled therapeutic intervention in order to improve the following deficits and impairments:  Decreased coordination, Decreased mobility, Decreased strength, Decreased range of motion, Decreased endurance, Decreased balance, Difficulty walking  Visit Diagnosis: Muscle weakness (generalized)  Difficulty in walking, not elsewhere classified     Problem List There are no active problems to display for this patient.   [name], PT DPT 04/13/2016, 11:15 AM  [place] MAIN [facility] SERVICES [address] [place], [place], [address] Phone: [phone]   Fax:  [phone]  Name: Evan Sanchez MRN: [id number] Date of Birth: 05-18-1940

## 2016-04-20 ENCOUNTER — Ambulatory Visit: Payer: Non-veteran care

## 2016-04-20 DIAGNOSIS — R262 Difficulty in walking, not elsewhere classified: Secondary | ICD-10-CM

## 2016-04-20 DIAGNOSIS — M6281 Muscle weakness (generalized): Secondary | ICD-10-CM | POA: Diagnosis not present

## 2016-04-20 NOTE — Therapy (Signed)
[place] MAIN [facility] SERVICES [address] [place], [place], [address] Phone: [phone]   Fax:  [phone]  Physical Therapy Treatment  Patient Details  Name: Evan Sanchez MRN: [id number] Date of Birth: 09/21/1940 Referring Provider: Jorge Mandril MD  Encounter Date: 04/20/2016      PT End of Session - 04/20/16 1046    Visit Number 9   Number of Visits 12   Date for PT Re-Evaluation 04/26/16   Authorization Type 9 of 12 preauth   PT Start Time 1030   PT Stop Time 1100   PT Time Calculation (min) 30 min   Equipment Utilized During Treatment Gait belt   Activity Tolerance Patient tolerated treatment well;No increased pain   Behavior During Therapy WFL for tasks assessed/performed      Past Medical History:  Diagnosis Date  . Atrial fibrillation ([place])   . CHF (congestive heart failure) ([place])     put on coumadin after had chf 3-4 yrs ago  . Current use of long term anticoagulation    Xarelto as of 11/22/2015  . Depression   . GERD (gastroesophageal reflux disease)    h/o acid reflux  . Heart murmur    as a child  . Hypertension    takes lisinopril - requested ekg, echo, stress tes t from [facility] clinic    Past Surgical History:  Procedure Laterality Date  . CARDIAC DEFIBRILLATOR PLACEMENT  ? 2010  . CARDIAC DEFIBRILLATOR PLACEMENT    . EYE SURGERY     right cataract removal  . INSERT / REPLACE / REMOVE PACEMAKER    . LUMBAR LAMINECTOMY/DECOMPRESSION MICRODISCECTOMY  03/04/2011   Procedure: LUMBAR LAMINECTOMY/DECOMPRESSION MICRODISCECTOMY;  Surgeon: [name];  Location: [place] NEURO ORS;  Service: Neurosurgery;  Laterality: Left;  LEFT Lumbar four-five LUMBAR MICRODISKECTOMY  . VASECTOMY      There were no vitals filed for this visit.      Subjective Assessment - 04/20/16 1042    Subjective Patient reports his balancing has been "pretty good" and feels he is improving. Patient states he feels his knee are  weak.    Pertinent History Cardiomyopathy, CHF,    Limitations Lifting;Standing;House hold activities   How long can you stand comfortably? As long as he wants   How long can you walk comfortably? 30 min   Patient Stated Goals Improve strength and balance   Currently in Pain? No/denies      TREATMENT: Nustep level 4 - 5 min for warm up and cueing needed for set up  Leg Press for quadriceps strengthening and calf raise with cueing on knee and hip positioning - 3 x 12 150# Mini lunges in standing - 2 x 15 Side stepping up and over bosu ball - x10 Weight shifts on black side of bous ball - x 20  Single leg stance with fingertip UE support - 2 x 30sec Feet together balance on black side of bosu ball - 1.58min Single leg heel raises on half blue foam - 2 x 20         PT Education - 04/20/16 1046    Education provided Yes   Education Details Knee positioning throughout treatment session    Person(s) Educated Patient   Methods Explanation;Demonstration   Comprehension Verbalized understanding;Returned demonstration             PT Long Term Goals - 03/15/16 1641      PT LONG TERM GOAL #1   Title Pt will  improve TUG score by 5 seconds to demonstrate significant improvement in functional mobility and decrease fall risk.    Baseline TUG: 16.4   Time 6   Period Weeks   Status New     PT LONG TERM GOAL #2   Title Patient will improve 5xSTS by 12 seconds to demonstrate significant improvement in functional LE strength and increased ease with transfering out of a chair.   Baseline 5xSTS: 27sec   Time 6   Period Weeks   Status New     PT LONG TERM GOAL #3   Title Patient will improve BERG score by 8 points to demonstrate significant improvement in static balance and decrease fall risk.    Baseline BERG: 40   Time 6   Period Weeks   Status New     PT LONG TERM GOAL #4   Title Patient will be independent with HEP focused on improving balance and strength to continue  benefits of therapy after discharge.    Baseline Dependent with cueing and performance of exercises.    Time 6   Period Weeks   Status New               Plan - 04/20/16 1048    Clinical Impression Statement Focused on improving quadriceps strengthening and balancing activities decrease fall risk when balancing. Performed 30 min session secondary to patient needing to take wife to an appointment. Patient continues to demonstrate decreased strength and patient will benefit from further skilled therapy to return to prior level of function.    Rehab Potential Good   Clinical Impairments Affecting Rehab Potential (+) highly motivated, family support (-) age   PT Frequency 2x / week   PT Duration 6 weeks   PT Treatment/Interventions Therapeutic activities;Therapeutic exercise;Balance training;Stair training;Gait training;Neuromuscular re-education;Patient/family education;Passive range of motion;Cryotherapy   PT Next Visit Plan Advance strengthening and balance interventions   PT Home Exercise Plan sit to stands, hip abduction, calf raises   Consulted and Agree with Plan of Care Patient      Patient will benefit from skilled therapeutic intervention in order to improve the following deficits and impairments:  Decreased coordination, Decreased mobility, Decreased strength, Decreased range of motion, Decreased endurance, Decreased balance, Difficulty walking  Visit Diagnosis: Muscle weakness (generalized)  Difficulty in walking, not elsewhere classified     Problem List There are no active problems to display for this patient.   [name], PT DPT 04/20/2016, 11:09 AM  [place] MAIN [facility] SERVICES [address] [place], [place], [address] Phone: [phone]   Fax:  [phone]  Name: Evan Sanchez MRN: [id number] Date of Birth: 1940-12-06

## 2016-04-25 HISTORY — PX: ATRIAL FIBRILLATION ABLATION: EP1191

## 2016-04-26 ENCOUNTER — Ambulatory Visit: Payer: Non-veteran care | Attending: Internal Medicine

## 2016-04-26 DIAGNOSIS — R262 Difficulty in walking, not elsewhere classified: Secondary | ICD-10-CM

## 2016-04-26 DIAGNOSIS — M6281 Muscle weakness (generalized): Secondary | ICD-10-CM | POA: Insufficient documentation

## 2016-04-26 NOTE — Therapy (Signed)
[place] MAIN [facility] SERVICES [address] [place], [place], [address] Phone: [phone]   Fax:  [phone]  Physical Therapy Treatment  Patient Details  Name: Evan Sanchez MRN: [id number] Date of Birth: Apr 16, 1941 Referring Provider: Jorge Mandril MD  Encounter Date: 04/26/2016      PT End of Session - 04/26/16 1043    Visit Number 10   Number of Visits 12   Date for PT Re-Evaluation 04/26/16   Authorization Type 9 of 12 preauth   PT Start Time 1037   PT Stop Time 1115   PT Time Calculation (min) 38 min   Equipment Utilized During Treatment Gait belt   Activity Tolerance Patient tolerated treatment well;No increased pain   Behavior During Therapy WFL for tasks assessed/performed      Past Medical History:  Diagnosis Date  . Atrial fibrillation ([place])   . CHF (congestive heart failure) ([place])     put on coumadin after had chf 3-4 yrs ago  . Current use of long term anticoagulation    Xarelto as of 11/22/2015  . Depression   . GERD (gastroesophageal reflux disease)    h/o acid reflux  . Heart murmur    as a child  . Hypertension    takes lisinopril - requested ekg, echo, stress tes t from [facility] clinic    Past Surgical History:  Procedure Laterality Date  . CARDIAC DEFIBRILLATOR PLACEMENT  ? 2010  . CARDIAC DEFIBRILLATOR PLACEMENT    . EYE SURGERY     right cataract removal  . INSERT / REPLACE / REMOVE PACEMAKER    . LUMBAR LAMINECTOMY/DECOMPRESSION MICRODISCECTOMY  03/04/2011   Procedure: LUMBAR LAMINECTOMY/DECOMPRESSION MICRODISCECTOMY;  Surgeon: [name];  Location: [place] NEURO ORS;  Service: Neurosurgery;  Laterality: Left;  LEFT Lumbar four-five LUMBAR MICRODISKECTOMY  . VASECTOMY      There were no vitals filed for this visit.      Subjective Assessment - 04/26/16 1041    Subjective Patient reports he is feeling a little out of breath from walking quickly into the building.    Pertinent History  Cardiomyopathy, CHF,    Limitations Lifting;Standing;House hold activities   How long can you stand comfortably? As long as he wants   How long can you walk comfortably? 30 min   Patient Stated Goals Improve strength and balance   Currently in Pain? No/denies            [facility] PT Assessment - 04/26/16 1047      Berg Balance Test   Sit to Stand Able to stand without using hands and stabilize independently   Standing Unsupported Able to stand safely 2 minutes   Sitting with Back Unsupported but Feet Supported on Floor or Stool Able to sit safely and securely 2 minutes   Stand to Sit Sits safely with minimal use of hands   Transfers Able to transfer safely, minor use of hands   Standing Unsupported with Eyes Closed Able to stand 10 seconds safely   Standing Ubsupported with Feet Together Able to place feet together independently and stand 1 minute safely   From Standing, Reach Forward with Outstretched Arm Can reach forward >12 cm safely (5")   From Standing Position, Pick up Object from Floor Able to pick up shoe safely and easily   From Standing Position, Turn to Look Behind Over each Shoulder Looks behind from both sides and weight shifts well   Turn 360 Degrees Able to turn 360  degrees safely in 4 seconds or less   Standing Unsupported, Alternately Place Feet on Step/Stool Able to complete 4 steps without aid or supervision   Standing Unsupported, One Foot in Front Able to plae foot ahead of the other independently and hold 30 seconds   Standing on One Leg Tries to lift leg/unable to hold 3 seconds but remains standing independently   Total Score 49       Observation: TUG: 14sec 5xSTS: 17sec TREATMENT: Widened tandem stance - 30sec B Seated hip abduction with GTB - x 20  Seated marches with GTB -  x 10  Semi tandem stance on purple airex - 2 x 40sec; head turns up/down x 10; head turns left/right x10  Sit to stand - 2 x 10 with cueing on foot position  Single leg stance with  fingertip UE support - 2 x 30sec Side stepping up and over bosu ball - x10 BAPS board level 2 in standing for single stance balance - 1 min each side Leg Press for quadriceps strengthening and calf raise with cueing on knee and hip positioning - 3 x 12 150#      PT Education - 04/26/16 1043    Education provided Yes   Education Details form and technique throughout exercise   Person(s) Educated Patient   Methods Explanation;Demonstration   Comprehension Verbalized understanding;Returned demonstration             PT Long Term Goals - 04/26/16 1045      PT LONG TERM GOAL #1   Title Pt will improve TUG score by 5 seconds to demonstrate significant improvement in functional mobility and decrease fall risk.    Baseline TUG: 16.4 04/26/16: 14 sec   Time 6   Period Weeks   Status On-going     PT LONG TERM GOAL #2   Title Patient will improve 5xSTS by 12 seconds to demonstrate significant improvement in functional LE strength and increased ease with transfering out of a chair.   Baseline 5xSTS: 27sec 04/27/15: 17sec   Time 6   Period Weeks   Status On-going     PT LONG TERM GOAL #3   Title Patient will improve BERG score by 8 points to demonstrate significant improvement in static balance and decrease fall risk.    Baseline BERG: 40 04/26/16: 49   Time 6   Period Weeks   Status On-going     PT LONG TERM GOAL #4   Title Patient will be independent with HEP focused on improving balance and strength to continue benefits of therapy after discharge.    Baseline Dependent with cueing and performance of exercises. 04/26/16: Requires moderate cueing on exercise technique and form throughout exercise performance.   Time 6   Period Weeks   Status On-going               Plan - 04/26/16 1114    Clinical Impression Statement Patient demonstrates improved functional outcome measures such as TUG, 5xSTS, and BERG balance score indicating significant improvement in static balance, LE  strength and fall risk. Although patient is making improvements, he continues to demonstrate decreased balance and muscular endurance and will benefit from further skilled therapy to return to prior level of function.    Rehab Potential Good   Clinical Impairments Affecting Rehab Potential (+) highly motivated, family support (-) age   PT Frequency 2x / week   PT Duration 6 weeks   PT Treatment/Interventions Therapeutic activities;Therapeutic exercise;Balance training;Stair training;Gait training;Neuromuscular re-education;Patient/family  education;Passive range of motion;Cryotherapy   PT Next Visit Plan Advance strengthening and balance interventions   PT Home Exercise Plan sit to stands, hip abduction, calf raises   Consulted and Agree with Plan of Care Patient      Patient will benefit from skilled therapeutic intervention in order to improve the following deficits and impairments:  Decreased coordination, Decreased mobility, Decreased strength, Decreased range of motion, Decreased endurance, Decreased balance, Difficulty walking  Visit Diagnosis: Muscle weakness (generalized)  Difficulty in walking, not elsewhere classified       G-Codes - May 21, 2016 1106    Functional Assessment Tool Used 5XSTS, BERG, TUG, clinical judgement   Functional Limitation Mobility: Walking and moving around   Mobility: Walking and Moving Around Current Status [id number]) At least 1 percent but less than 20 percent impaired, limited or restricted   Mobility: Walking and Moving Around Goal Status [phone]) At least 1 percent but less than 20 percent impaired, limited or restricted      Problem List There are no active problems to display for this patient.   [name], PT DPT 05/21/16, 11:31 AM  [place] MAIN [facility] SERVICES [address] [place], [place], [address] Phone: [phone]   Fax:  [phone]  Name: Evan Sanchez MRN: [id number] Date of Birth:  05/13/1940

## 2016-05-03 ENCOUNTER — Ambulatory Visit: Payer: Non-veteran care

## 2016-05-09 DIAGNOSIS — Z95810 Presence of automatic (implantable) cardiac defibrillator: Secondary | ICD-10-CM | POA: Diagnosis not present

## 2016-05-09 DIAGNOSIS — F1010 Alcohol abuse, uncomplicated: Secondary | ICD-10-CM | POA: Diagnosis not present

## 2016-05-09 DIAGNOSIS — I110 Hypertensive heart disease with heart failure: Secondary | ICD-10-CM | POA: Diagnosis not present

## 2016-05-09 DIAGNOSIS — Z9181 History of falling: Secondary | ICD-10-CM | POA: Diagnosis not present

## 2016-05-09 DIAGNOSIS — I4891 Unspecified atrial fibrillation: Secondary | ICD-10-CM | POA: Diagnosis not present

## 2016-05-09 DIAGNOSIS — I5023 Acute on chronic systolic (congestive) heart failure: Secondary | ICD-10-CM | POA: Diagnosis not present

## 2016-05-09 DIAGNOSIS — F0390 Unspecified dementia without behavioral disturbance: Secondary | ICD-10-CM | POA: Diagnosis not present

## 2016-05-10 ENCOUNTER — Ambulatory Visit: Payer: Non-veteran care

## 2016-09-22 DIAGNOSIS — B029 Zoster without complications: Secondary | ICD-10-CM | POA: Diagnosis not present

## 2016-09-22 DIAGNOSIS — X32XXXA Exposure to sunlight, initial encounter: Secondary | ICD-10-CM | POA: Diagnosis not present

## 2016-09-22 DIAGNOSIS — Z08 Encounter for follow-up examination after completed treatment for malignant neoplasm: Secondary | ICD-10-CM | POA: Diagnosis not present

## 2016-09-22 DIAGNOSIS — L570 Actinic keratosis: Secondary | ICD-10-CM | POA: Diagnosis not present

## 2016-09-22 DIAGNOSIS — Z85828 Personal history of other malignant neoplasm of skin: Secondary | ICD-10-CM | POA: Diagnosis not present

## 2017-06-02 IMAGING — CR DG SHOULDER 2+V*L*
3 series · 3 of 3 positions shown · non-contrast
Comparison: Chest x-ray of February 28, 2011 which included a
portion of the left shoulder.

CLINICAL DATA: Patient tripped and fell forward today injuring the
left shoulder.

EXAM:
LEFT SHOULDER - 2+ VIEW

[shoulder grashey]
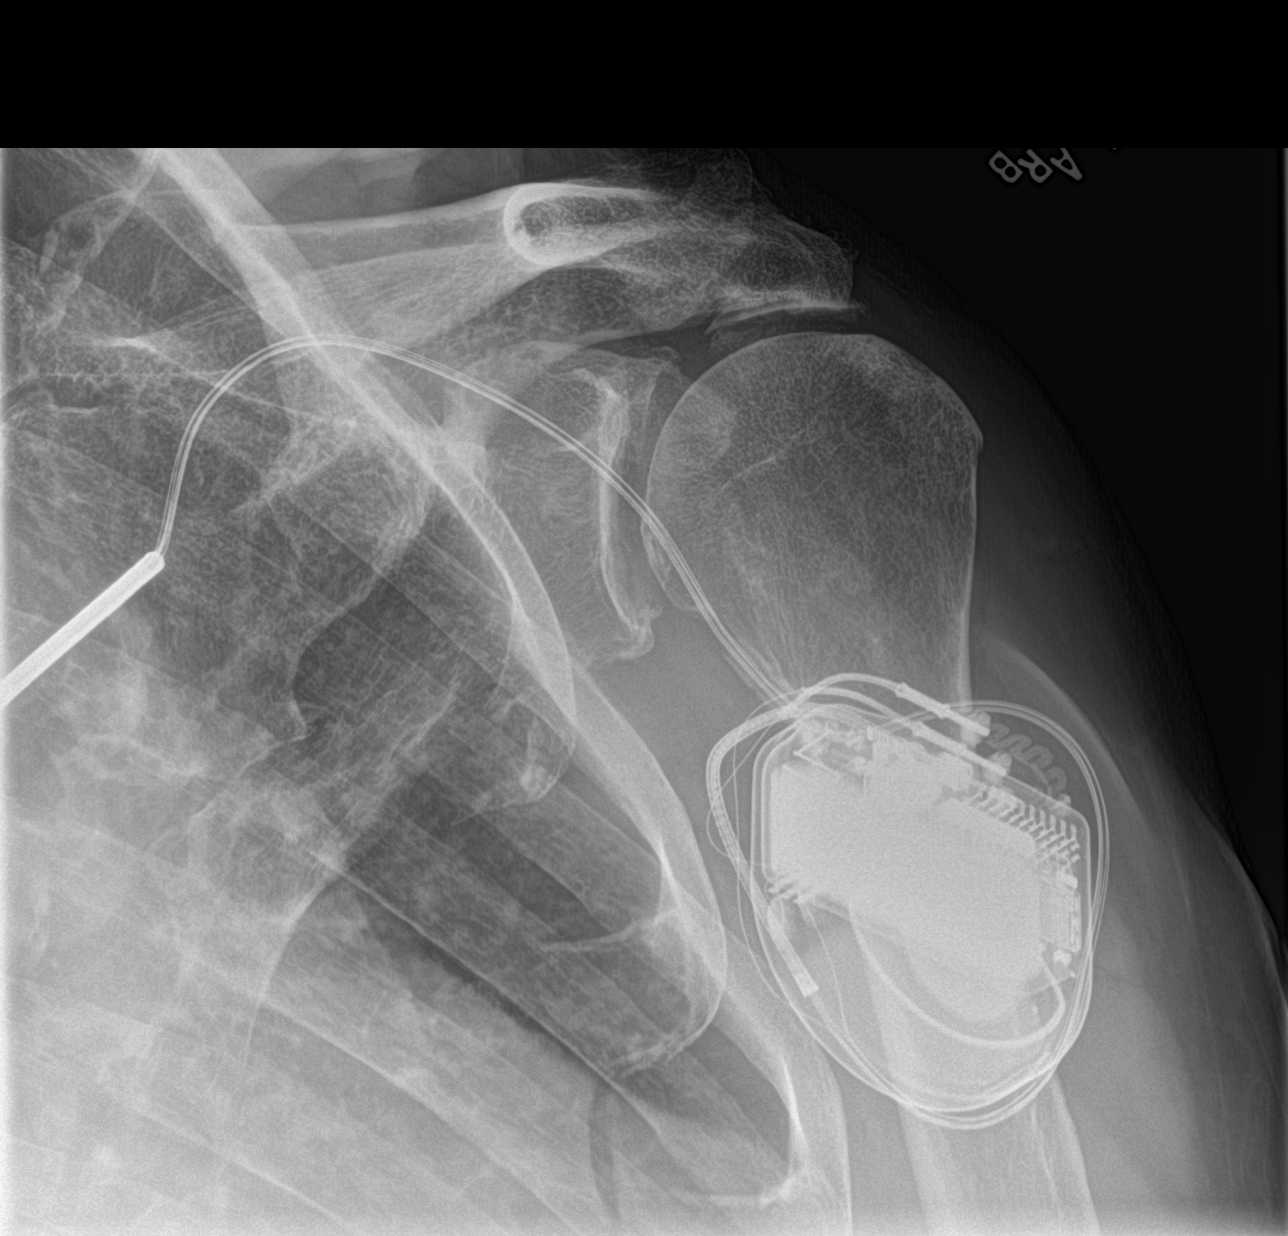

[shoulder y view]
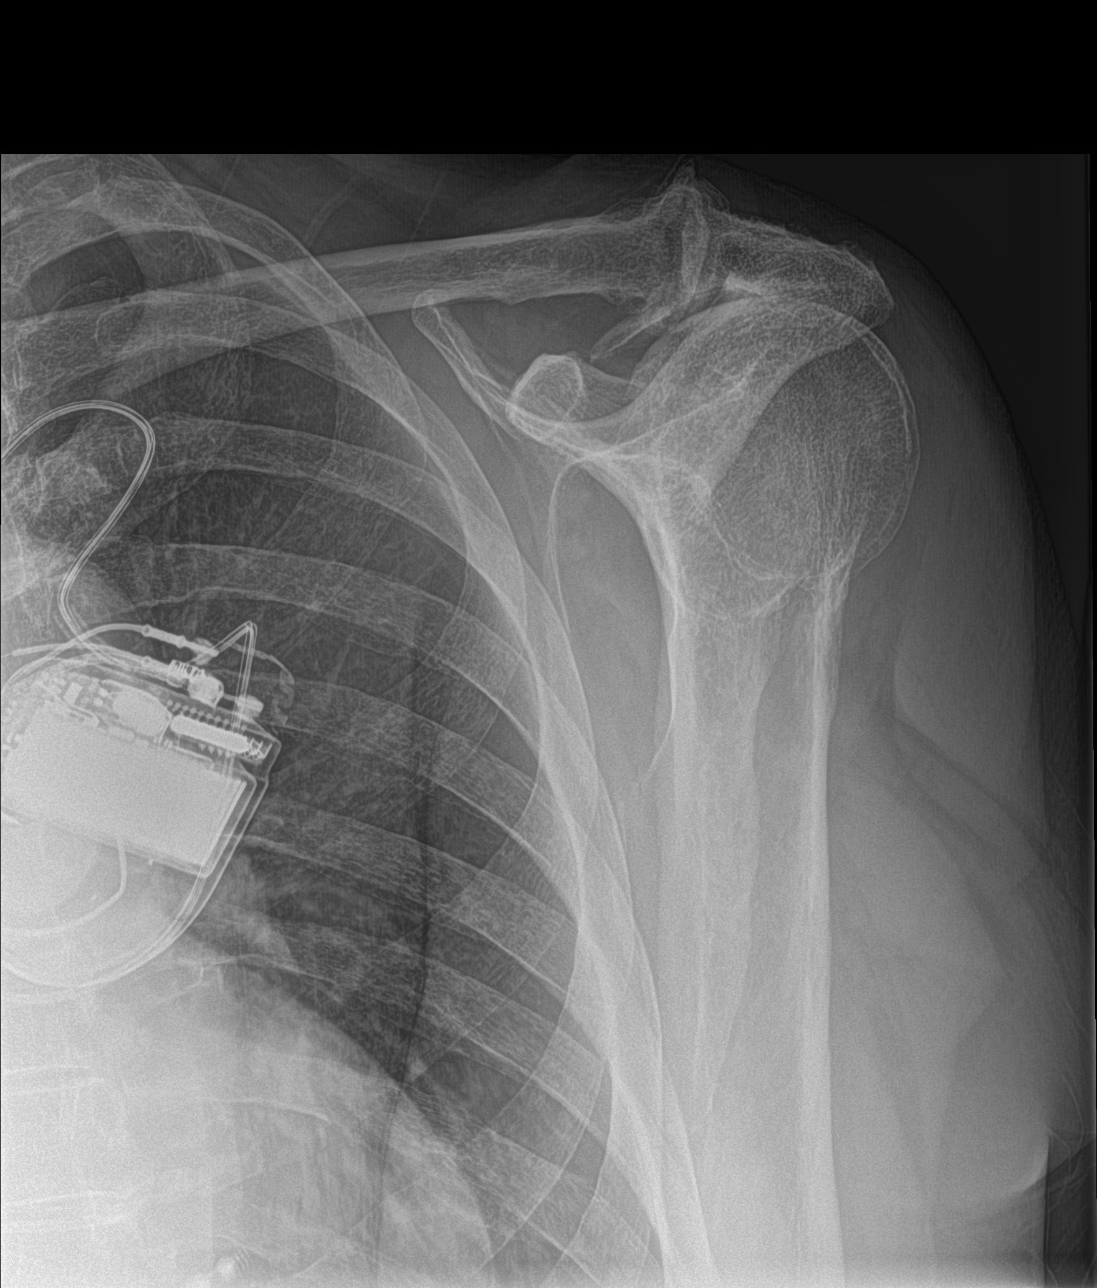

[shoulder axillary]
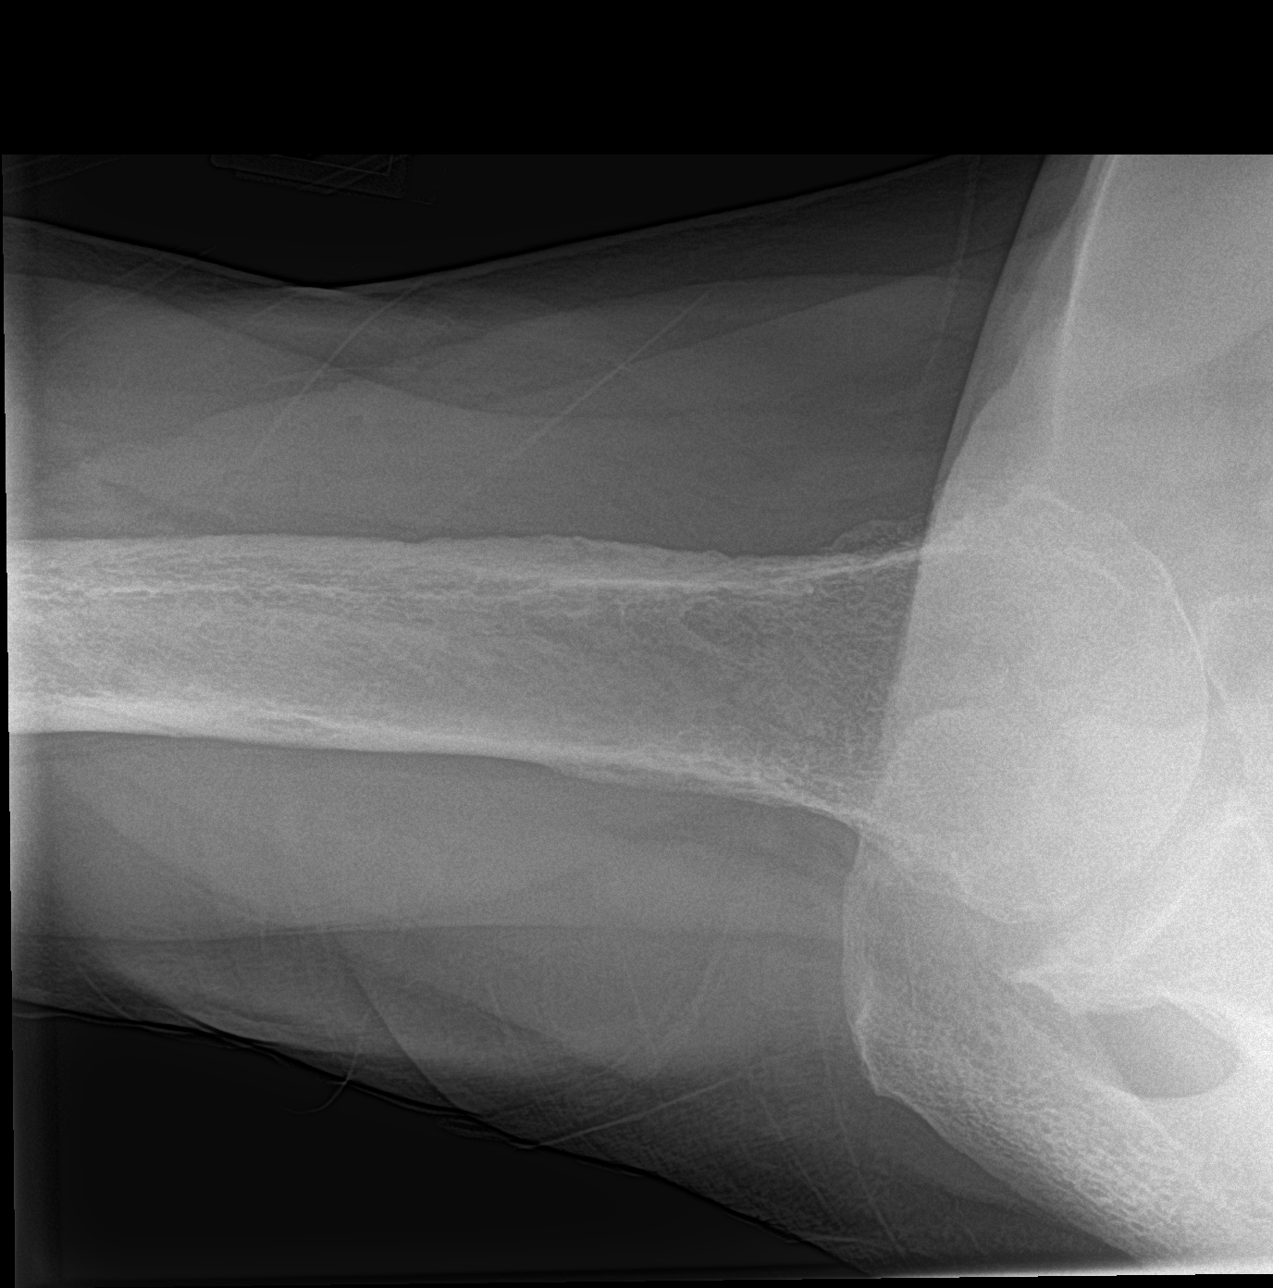

[3 of 3 positions shown; findings below may reference images not displayed]

FINDINGS: The bones of the shoulder appear mildly osteopenic. The proximal
humerus appears intact. The humeral head and neck appear normal. The
glenohumeral joint space is preserved. Small articular margin
osteophytes are noted arising from the bony glenoid. There is
calcification in the subacromial subdeltoid space with marked
narrowing of the space. There is hypertrophy of the AC joint. The
observed portions of the left clavicle and upper left ribs exhibit
no acute abnormalities. A permanent pacemaker generator is present
in the left pectoral region.
IMPRESSION: There is moderate degenerative change of the left shoulder centered
on the AC joint and subacromial space with milder changes of the
glenohumeral joint. There is no acute fracture nor dislocation.

## 2017-06-14 ENCOUNTER — Emergency Department: Payer: Medicare HMO

## 2017-06-14 ENCOUNTER — Observation Stay
Admission: EM | Admit: 2017-06-14 | Discharge: 2017-06-14 | Payer: Medicare HMO | Attending: Family Medicine | Admitting: Family Medicine

## 2017-06-14 ENCOUNTER — Other Ambulatory Visit: Payer: Self-pay

## 2017-06-14 ENCOUNTER — Encounter: Payer: Self-pay | Admitting: Internal Medicine

## 2017-06-14 DIAGNOSIS — Z8249 Family history of ischemic heart disease and other diseases of the circulatory system: Secondary | ICD-10-CM | POA: Insufficient documentation

## 2017-06-14 DIAGNOSIS — I509 Heart failure, unspecified: Secondary | ICD-10-CM | POA: Diagnosis not present

## 2017-06-14 DIAGNOSIS — Y718 Miscellaneous cardiovascular devices associated with adverse incidents, not elsewhere classified: Secondary | ICD-10-CM | POA: Diagnosis not present

## 2017-06-14 DIAGNOSIS — I420 Dilated cardiomyopathy: Secondary | ICD-10-CM | POA: Diagnosis not present

## 2017-06-14 DIAGNOSIS — I482 Chronic atrial fibrillation, unspecified: Secondary | ICD-10-CM | POA: Diagnosis present

## 2017-06-14 DIAGNOSIS — I2510 Atherosclerotic heart disease of native coronary artery without angina pectoris: Secondary | ICD-10-CM | POA: Diagnosis not present

## 2017-06-14 DIAGNOSIS — Z7982 Long term (current) use of aspirin: Secondary | ICD-10-CM | POA: Insufficient documentation

## 2017-06-14 DIAGNOSIS — Z87891 Personal history of nicotine dependence: Secondary | ICD-10-CM | POA: Diagnosis not present

## 2017-06-14 DIAGNOSIS — I361 Nonrheumatic tricuspid (valve) insufficiency: Secondary | ICD-10-CM | POA: Diagnosis not present

## 2017-06-14 DIAGNOSIS — I10 Essential (primary) hypertension: Secondary | ICD-10-CM | POA: Diagnosis present

## 2017-06-14 DIAGNOSIS — Z79899 Other long term (current) drug therapy: Secondary | ICD-10-CM | POA: Diagnosis not present

## 2017-06-14 DIAGNOSIS — T82198A Other mechanical complication of other cardiac electronic device, initial encounter: Secondary | ICD-10-CM | POA: Diagnosis present

## 2017-06-14 DIAGNOSIS — F0390 Unspecified dementia without behavioral disturbance: Secondary | ICD-10-CM | POA: Diagnosis not present

## 2017-06-14 DIAGNOSIS — F329 Major depressive disorder, single episode, unspecified: Secondary | ICD-10-CM | POA: Insufficient documentation

## 2017-06-14 DIAGNOSIS — I5022 Chronic systolic (congestive) heart failure: Secondary | ICD-10-CM | POA: Diagnosis not present

## 2017-06-14 DIAGNOSIS — Z7901 Long term (current) use of anticoagulants: Secondary | ICD-10-CM | POA: Diagnosis not present

## 2017-06-14 DIAGNOSIS — E785 Hyperlipidemia, unspecified: Secondary | ICD-10-CM | POA: Diagnosis present

## 2017-06-14 DIAGNOSIS — I428 Other cardiomyopathies: Secondary | ICD-10-CM | POA: Diagnosis not present

## 2017-06-14 DIAGNOSIS — K219 Gastro-esophageal reflux disease without esophagitis: Secondary | ICD-10-CM | POA: Insufficient documentation

## 2017-06-14 DIAGNOSIS — R918 Other nonspecific abnormal finding of lung field: Secondary | ICD-10-CM | POA: Diagnosis not present

## 2017-06-14 DIAGNOSIS — I4891 Unspecified atrial fibrillation: Secondary | ICD-10-CM | POA: Diagnosis not present

## 2017-06-14 DIAGNOSIS — Z95810 Presence of automatic (implantable) cardiac defibrillator: Secondary | ICD-10-CM | POA: Diagnosis not present

## 2017-06-14 DIAGNOSIS — T82118A Breakdown (mechanical) of other cardiac electronic device, initial encounter: Secondary | ICD-10-CM | POA: Diagnosis not present

## 2017-06-14 DIAGNOSIS — T82897A Other specified complication of cardiac prosthetic devices, implants and grafts, initial encounter: Secondary | ICD-10-CM | POA: Diagnosis not present

## 2017-06-14 DIAGNOSIS — T82110A Breakdown (mechanical) of cardiac electrode, initial encounter: Secondary | ICD-10-CM | POA: Diagnosis not present

## 2017-06-14 DIAGNOSIS — I110 Hypertensive heart disease with heart failure: Secondary | ICD-10-CM | POA: Diagnosis not present

## 2017-06-14 DIAGNOSIS — T829XXA Unspecified complication of cardiac and vascular prosthetic device, implant and graft, initial encounter: Secondary | ICD-10-CM | POA: Diagnosis present

## 2017-06-14 LAB — CBC
HCT: 40 % (ref 40.0–52.0)
Hemoglobin: 13.5 g/dL (ref 13.0–18.0)
MCH: 32 pg (ref 26.0–34.0)
MCHC: 33.7 g/dL (ref 32.0–36.0)
MCV: 95.2 fL (ref 80.0–100.0)
PLATELETS: 284 10*3/uL (ref 150–440)
RBC: 4.2 MIL/uL — AB (ref 4.40–5.90)
RDW: 14.1 % (ref 11.5–14.5)
WBC: 7.8 10*3/uL (ref 3.8–10.6)

## 2017-06-14 LAB — COMPREHENSIVE METABOLIC PANEL
ALBUMIN: 4.1 g/dL (ref 3.5–5.0)
ALT: 51 U/L (ref 17–63)
AST: 65 U/L — AB (ref 15–41)
Alkaline Phosphatase: 73 U/L (ref 38–126)
Anion gap: 11 (ref 5–15)
BUN: 12 mg/dL (ref 6–20)
CHLORIDE: 102 mmol/L (ref 101–111)
CO2: 25 mmol/L (ref 22–32)
CREATININE: 0.83 mg/dL (ref 0.61–1.24)
Calcium: 8.6 mg/dL — ABNORMAL LOW (ref 8.9–10.3)
GFR calc Af Amer: >60 mL/min (ref >60)
GFR calc non Af Amer: >60 mL/min (ref >60)
GLUCOSE: 120 mg/dL — AB (ref 65–99)
POTASSIUM: 3.7 mmol/L (ref 3.5–5.1)
SODIUM: 138 mmol/L (ref 135–145)
Total Bilirubin: 0.7 mg/dL (ref 0.3–1.2)
Total Protein: 7 g/dL (ref 6.5–8.1)

## 2017-06-14 LAB — TROPONIN I: Troponin I: <0.03 ng/mL (ref <0.03)

## 2017-06-14 MED ORDER — ASPIRIN EC 81 MG PO TBEC
81.0000 mg | DELAYED_RELEASE_TABLET | Freq: Every day | ORAL | Status: DC
  Filled 2017-06-14: qty 1

## 2017-06-14 MED ORDER — ONDANSETRON HCL 4 MG/2ML IJ SOLN: 4.0000 mg | Freq: Four times a day (QID) | INTRAMUSCULAR | Status: DC | PRN

## 2017-06-14 MED ORDER — RIVAROXABAN 20 MG PO TABS: 20.0000 mg | ORAL_TABLET | Freq: Every day | ORAL | Status: DC

## 2017-06-14 MED ORDER — GABAPENTIN 300 MG PO CAPS: 300.0000 mg | ORAL_CAPSULE | Freq: Every day | ORAL | Status: DC

## 2017-06-14 MED ORDER — CITALOPRAM HYDROBROMIDE 20 MG PO TABS
20.0000 mg | ORAL_TABLET | Freq: Every day | ORAL | Status: DC
  Administered 2017-06-14: 20 mg via ORAL
  Filled 2017-06-14: qty 1

## 2017-06-14 MED ORDER — CARVEDILOL 25 MG PO TABS
25.0000 mg | ORAL_TABLET | Freq: Two times a day (BID) | ORAL | Status: DC
  Administered 2017-06-14: 25 mg via ORAL
  Filled 2017-06-14: qty 1

## 2017-06-14 MED ORDER — DIGOXIN 125 MCG PO TABS
125.0000 ug | ORAL_TABLET | Freq: Every day | ORAL | Status: DC
  Administered 2017-06-14: 125 ug via ORAL
  Filled 2017-06-14: qty 1

## 2017-06-14 MED ORDER — SIMVASTATIN 20 MG PO TABS: 40.0000 mg | ORAL_TABLET | Freq: Every day | ORAL | Status: DC

## 2017-06-14 MED ORDER — ACETAMINOPHEN 650 MG RE SUPP: 650.0000 mg | Freq: Four times a day (QID) | RECTAL | Status: DC | PRN

## 2017-06-14 MED ORDER — ACETAMINOPHEN 325 MG PO TABS: 650.0000 mg | ORAL_TABLET | Freq: Four times a day (QID) | ORAL | Status: DC | PRN

## 2017-06-14 MED ORDER — FUROSEMIDE 20 MG PO TABS
20.0000 mg | ORAL_TABLET | Freq: Every day | ORAL | Status: DC
  Administered 2017-06-14: 20 mg via ORAL
  Filled 2017-06-14: qty 1

## 2017-06-14 MED ORDER — LISINOPRIL 5 MG PO TABS
5.0000 mg | ORAL_TABLET | Freq: Every day | ORAL | Status: DC
  Administered 2017-06-14: 5 mg via ORAL
  Filled 2017-06-14: qty 1

## 2017-06-14 MED ORDER — ONDANSETRON HCL 4 MG PO TABS: 4.0000 mg | ORAL_TABLET | Freq: Four times a day (QID) | ORAL | Status: DC | PRN

## 2017-06-14 NOTE — Progress Notes (Signed)
[facility] arrived to pick up patient.

## 2017-06-14 NOTE — Progress Notes (Signed)
   06/14/17 1000  Clinical Encounter Type  Visited With Patient;Patient and family together  Visit Type Initial;Spiritual support  Referral From Nurse  Spiritual Encounters  Spiritual Needs Prayer;Emotional  [place] responded to patient prayer request; [place] met with patient and wife at bedside; [facility] offered spiritual and emotional support along with prayer.

## 2017-06-14 NOTE — ED Notes (Signed)
PT taken to floor via  Stretcher. VSS. NAD. Denies any pain. Report called to RN on tele. All questions answered.

## 2017-06-14 NOTE — ED Provider Notes (Signed)
[facility] Emergency Department Provider Note ______   First MD Initiated Contact with Patient 06/14/17 0500     (approximate)  I have reviewed the triage vital signs and the nursing notes.   HISTORY  Chief Complaint Chest Pain    HPI Evan Sanchez is a 77 y.o. male with below list of chronic medical conditions including atrial fibrillation and congestive heart failure presents to the emergency department with hearing a alarm from his defibrillator twice tonight.  Patient denies any shock administered.  Patient denies any chest pain no rapid heartbeat or irregular heartbeat.   Past Medical History:  Diagnosis Date  . Atrial fibrillation ([place])   . CHF (congestive heart failure) ([place])     put on coumadin after had chf 3-4 yrs ago  . Current use of long term anticoagulation    Xarelto as of 11/22/2015  . Depression   . GERD (gastroesophageal reflux disease)    h/o acid reflux  . Heart murmur    as a child  . Hypertension    takes lisinopril - requested ekg, echo, stress tes t from [facility]    There are no active problems to display for this patient.   Past Surgical History:  Procedure Laterality Date  . CARDIAC DEFIBRILLATOR PLACEMENT  ? 2010  . CARDIAC DEFIBRILLATOR PLACEMENT    . EYE SURGERY     right cataract removal  . INSERT / REPLACE / REMOVE PACEMAKER    . LUMBAR LAMINECTOMY/DECOMPRESSION MICRODISCECTOMY  03/04/2011   Procedure: LUMBAR LAMINECTOMY/DECOMPRESSION MICRODISCECTOMY;  Surgeon: [name];  Location: [facility];  Service: Neurosurgery;  Laterality: Left;  LEFT Lumbar four-five LUMBAR MICRODISKECTOMY  . VASECTOMY      Prior to Admission medications   Medication Sig Start Date End Date Taking? Authorizing Provider  aspirin EC 81 MG tablet Take 81 mg by mouth daily.      [provider]  carvedilol (COREG) 25 MG tablet Take 25 mg by mouth 2 (two) times daily with a meal.      [provider]    citalopram (CELEXA) 20 MG tablet Take 20 mg by mouth daily.      [provider]  digoxin (LANOXIN) 0.125 MG tablet Take 125 mcg by mouth daily.      [provider]  furosemide (LASIX) 20 MG tablet Take 20 mg by mouth daily.      [provider]  gabapentin (NEURONTIN) 300 MG capsule Take 300 mg by mouth at bedtime.      [provider]  HYDROcodone-acetaminophen (NORCO) 5-325 MG per tablet Take 1 tablet by mouth every 6 (six) hours as needed. For pain      [provider]  lisinopril (PRINIVIL,ZESTRIL) 5 MG tablet Take 5 mg by mouth daily.      [provider]  Multiple Vitamins-Minerals (MULTIVITAMINS THER. W/MINERALS) TABS Take 1 tablet by mouth daily.      [provider]  Omega-3 Fatty Acids (FISH OIL) 1200 MG CAPS Take 1,200 mg by mouth daily.      [provider]  simvastatin (ZOCOR) 40 MG tablet Take 40 mg by mouth at bedtime.      [provider]  warfarin (COUMADIN) 3 MG tablet Take 3 mg by mouth daily.      [provider]    Allergies No known drug allergies No family history on file.  Social History Social History   Tobacco Use  . Smoking status:  Former Smoker    Packs/day: 2.00    Years: 25.00    Pack years: 50.00    Types: Cigarettes    Last attempt to quit: 02/28/1976    Years since quitting: 41.3  . Smokeless tobacco: Never Used  Substance Use Topics  . Alcohol use: Yes    Alcohol/week: 2.4 oz    Types: 3 Glasses of wine, 1 Cans of beer per week  . Drug use: No    Review of Systems Constitutional: No fever/chills Eyes: No visual changes. ENT: No sore throat. Cardiovascular: Denies chest pain. Respiratory: Denies shortness of breath. Gastrointestinal: No abdominal pain.  No nausea, no vomiting.  No diarrhea.  No constipation. Genitourinary: Negative for dysuria. Musculoskeletal: Negative for neck pain.  Negative for back pain. Integumentary: Negative for  rash. Neurological: Negative for headaches, focal weakness or numbness.    ____________________________________________   PHYSICAL EXAM:  VITAL SIGNS: ED Triage Vitals  Enc Vitals Group     BP 06/14/17 0515 121/89     Pulse Rate 06/14/17 0515 (!) 104     Resp 06/14/17 0515 18     Temp --      Temp src --      SpO2 06/14/17 0515 100 %     Weight 06/14/17 0504 93 kg (205 lb)     Height 06/14/17 0504 1.803 m (5\' 11" )     Head Circumference --      Peak Flow --      Pain Score --      Pain Loc --      Pain Edu? --      Excl. in [place]? --     Constitutional: Alert and oriented. Well appearing and in no acute distress. Eyes: Conjunctivae are normal. PERRL. EOMI. Head: Atraumatic.[place] Kitchen Mouth/Throat: Mucous membranes are moist Neck: No stridor.   Cardiovascular: Normal rate, irregular rhythm. Good peripheral circulation. Grossly normal heart sounds. Respiratory: Normal respiratory effort.  No retractions. Lungs CTAB. Gastrointestinal: Soft and nontender. No distention.  Musculoskeletal: No lower extremity tenderness nor edema. No gross deformities of extremities. Neurologic:  Normal speech and language. No gross focal neurologic deficits are appreciated.  Skin:  Skin is warm, dry and intact. No rash noted. Psychiatric: Mood and affect are normal. Speech and behavior are normal.  ____________________________________________   LABS (all labs ordered are listed, but only abnormal results are displayed)  Labs Reviewed  CBC - Abnormal; Notable for the following components:      Result Value   RBC 4.20 (*)    All other components within normal limits  COMPREHENSIVE METABOLIC PANEL - Abnormal; Notable for the following components:   Glucose, Bld 120 (*)    Calcium 8.6 (*)    AST 65 (*)    All other components within normal limits  TROPONIN I   ____________________________________________  EKG  ED ECG REPORT I, [place] N BROWN, the attending physician, personally viewed and  interpreted this ECG.   Date: 06/14/2017  EKG Time: 5:05 AM  Rate: 109  Rhythm: Atrial fibrillation with rapid ventricular response  Axis: Normal  Intervals: Irregular PR interval  ST&T Change: None  ____________________________________________  RADIOLOGY I, [place] N BROWN, personally viewed and evaluated these images (plain radiographs) as part of my medical decision making, as well as reviewing the written report by the radiologist.  ED MD interpretation: No acute cardiopulmonary findings  Official radiology report(s): Dg Chest Portable 1 View  Result Date: 06/14/2017 CLINICAL DATA:  Chest pain.  Defibrillator fired.  EXAM: PORTABLE CHEST 1 VIEW COMPARISON:  02/28/2011 FINDINGS: Left-sided pacemaker in place with leads overlying the right ventricle. Unchanged heart size and mediastinal contours with borderline cardiomegaly. No pulmonary edema, focal airspace disease, large pleural effusion or pneumothorax. Degenerative change of the right shoulder. IMPRESSION: No acute abnormality.  Left-sided pacemaker in place. Electronically Signed   By: Jeb Levering M.D.   On: 06/14/2017 05:26    \d:   Procedures   ____________________________________________   INITIAL IMPRESSION / ASSESSMENT AND PLAN / ED COURSE  As part of my medical decision making, I reviewed the following data within the electronic MEDICAL RECORD [id number] year old male presenting with above-stated history and physical exam.  Patient's AICD interrogated and I was notified by [employer] that there is a function that was noted possibility of over sensing pacer wire.  [facility] however [facility] have no available [facility].  As such patient was admitted here to [place] [facility] patient discussed with Dr. 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    Gregor Hams, MD 06/14/17 0730

## 2017-06-14 NOTE — Discharge Summary (Signed)
[facility] [place] NAME: Evan Sanchez    MR#:  [id number]  DATE OF BIRTH:  02-Dec-1940  DATE OF ADMISSION:  06/14/2017 ADMITTING PHYSICIAN: Lance Coon, MD  DATE OF DISCHARGE: No discharge date for patient encounter.  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 Atrial fibrillation ([place])   . CHF (congestive heart failure) ([place])     put on coumadin after had chf 3-4 yrs ago  . Current use of long term anticoagulation    Xarelto as of 11/22/2015  . Depression   . GERD (gastroesophageal reflux disease)    h/o acid reflux  . Heart murmur    as a child  . Hypertension    takes lisinopril - requested ekg, echo, stress tes t from [place]:  1 acute dysfunctional pacer/AICD Patient states he had his original device put in here years ago, and several months ago had it replaced at the [place]. Cardiology did see patient while in house, arrangements were made for patient to be transferred to [facility], patient accepted by Dr. Marya Landry physician, Xarelto to be held, as further pacer interrogation noted for possible lead placement versus fracture which cannot be repaired here, discharge arrangements made by cardiology/Dr. Ubaldo Glassing  2 chronic benign essential hypertension  Stable on current regiment   3 history of coronary artery disease  Stable on home regiment which will continue   4 history of chronic A. Fib Coumadin held for possible future operative  intervention Continue digoxin and Coreg   5 chronic hyperlipidemia, unspecified  Stable  Continue statin therapy    DISCHARGE CONDITIONS:  On the day of discharge patient is afebrile, he dynamically stable, ready for transfer continue medical management per above, for more specific details please see chart  CONSULTS OBTAINED:  Treatment Team:  Teodoro Spray, MD Isaias Cowman, MD  DRUG ALLERGIES:  No Known Allergies  DISCHARGE MEDICATIONS:   Allergies as of 06/14/2017   No Known Allergies     Medication List    TAKE these medications   aspirin EC 81 MG tablet Take 81 mg by mouth daily.   carvedilol 25 MG tablet Commonly known as:  COREG Take 25 mg by mouth 2 (two) times daily with a meal.   citalopram 20 MG tablet Commonly known as:  CELEXA Take 20 mg by mouth daily.   digoxin 0.125 MG tablet Commonly known as:  LANOXIN Take 125 mcg by mouth daily.   donepezil 5 MG tablet Commonly known as:  ARICEPT Take 5 mg by mouth at bedtime.   DULoxetine 60 MG capsule Commonly known as:  CYMBALTA Take 60 mg by mouth daily.   Fish Oil 1200 MG Caps Take 1,200 mg by mouth daily.   furosemide 20 MG tablet Commonly known as:  LASIX Take 20 mg by mouth daily.   gabapentin 300 MG capsule Commonly known as:  NEURONTIN Take 300 mg by mouth at bedtime.   HYDROcodone-acetaminophen 5-325 MG tablet Commonly known as:  NORCO/VICODIN Take 1 tablet by mouth every 6 (six) hours as needed.  For pain   lisinopril 5 MG tablet Commonly known as:  PRINIVIL,ZESTRIL Take 5 mg by mouth daily.   losartan 50 MG tablet Commonly known as:  COZAAR Take 50 mg by mouth daily.   multivitamins ther. w/minerals Tabs tablet Take 1 tablet by mouth daily.   nitroGLYCERIN 0.4 MG SL tablet Commonly known as:  NITROSTAT Place under the tongue.   simvastatin 40 MG tablet Commonly known as:  ZOCOR Take 40 mg by mouth at bedtime.   warfarin 3 MG tablet Commonly known as:   COUMADIN Take 3 mg by mouth daily.   XARELTO 20 MG Tabs tablet Generic drug:  rivaroxaban Take 20 mg by mouth daily with supper.        DISCHARGE INSTRUCTIONS:   If you experience worsening of your admission symptoms, develop shortness of breath, life threatening emergency, suicidal or homicidal thoughts you must seek medical attention immediately by calling 911 or calling your MD immediately  if symptoms less severe.  You Must read complete instructions/literature along with all the possible adverse reactions/side effects for all the Medicines you take and that have been prescribed to you. Take any new Medicines after you have completely understood and accept all the possible adverse reactions/side effects.   Please note  You were cared for by a hospitalist during your hospital stay. If you have any questions about your discharge medications or the care you received while you were in the hospital after you are discharged, you can call the unit and asked to speak with the hospitalist on call if the hospitalist that took care of you is not available. Once you are discharged, your primary care physician will handle any further medical issues. Please note that NO REFILLS for any discharge medications will be authorized once you are discharged, as it is imperative that you return to your primary care physician (or establish a relationship with a primary care physician if you do not have one) for your aftercare needs so that they can reassess your need for medications and monitor your lab values.    Today   CHIEF COMPLAINT:   Chief Complaint  Patient presents with  . Chest Pain    HISTORY OF PRESENT ILLNESS:  77 y.o. male who presents with defibrillator alert.  Patient states that he was sleeping when he heard a beeping sound.  His wife states that she also heard her and thought his defibrillator had fired.  Patient states he did not feel his defibrillator fired any point.  When they  heard the sound again decided to come in for evaluation.  Interrogation here initially shows possible lead malfunction.  Hospitalist called for admission with cardiology consult. VITAL SIGNS:  Blood pressure 122/74, pulse 87, temperature 98.6 F (37 C), temperature source Oral, resp. rate 20, height 5\' 11"  (1.803 m), weight 93 kg (205 lb), SpO2 98 %.  I/O:  No intake or output data in the 24 hours ending 06/14/17 1515  PHYSICAL EXAMINATION:  GENERAL:  77 y.o.-year-old patient lying in the bed with no acute distress.  EYES: Pupils equal, round, reactive to light and accommodation. No scleral icterus. Extraocular muscles intact.  HEENT: Head atraumatic, normocephalic. Oropharynx and nasopharynx clear.  NECK:  Supple, no jugular venous distention. No thyroid enlargement, no tenderness.  LUNGS: Normal breath sounds bilaterally, no wheezing, rales,rhonchi or crepitation. No use of accessory muscles of respiration.  CARDIOVASCULAR: S1, S2 normal. No murmurs, rubs, or gallops.  ABDOMEN: Soft, non-tender, non-distended. Bowel sounds present. No organomegaly or  mass.  EXTREMITIES: No pedal edema, cyanosis, or clubbing.  NEUROLOGIC: Cranial nerves II through XII are intact. Muscle strength 5/5 in all extremities. Sensation intact. Gait not checked.  PSYCHIATRIC: The patient is alert and oriented x 3.  SKIN: No obvious rash, lesion, or ulcer.   DATA REVIEW:   CBC Recent Labs  Lab 06/14/17 0509  WBC 7.8  HGB 13.5  HCT 40.0  PLT 284    Chemistries  Recent Labs  Lab 06/14/17 0509  NA 138  K 3.7  CL 102  CO2 25  GLUCOSE 120*  BUN 12  CREATININE 0.83  CALCIUM 8.6*  AST 65*  ALT 51  ALKPHOS 73  BILITOT 0.7    Cardiac Enzymes Recent Labs  Lab 06/14/17 0509  TROPONINI <0.03    Microbiology Results  Results for orders placed or performed during the hospital encounter of 02/28/11  Surgical pcr screen     Status: Abnormal   Collection Time: 02/28/11 10:35 AM  Result Value Ref  Range Status   MRSA, PCR NEGATIVE NEGATIVE Final   Staphylococcus aureus POSITIVE (A) NEGATIVE Final    Comment:        The Xpert SA Assay (FDA approved for NASAL specimens only), is one component of a comprehensive surveillance program.  It is not intended to diagnose infection nor to guide or monitor treatment.    RADIOLOGY:  Dg Chest Portable 1 View  Result Date: 06/14/2017 CLINICAL DATA:  Chest pain.  Defibrillator fired. EXAM: PORTABLE CHEST 1 VIEW COMPARISON:  02/28/2011 FINDINGS: Left-sided pacemaker in place with leads overlying the right ventricle. Unchanged heart size and mediastinal contours with borderline cardiomegaly. No pulmonary edema, focal airspace disease, large pleural effusion or pneumothorax. Degenerative change of the right shoulder. IMPRESSION: No acute abnormality.  Left-sided pacemaker in place. Electronically Signed   By: Jeb Levering M.D.   On: 06/14/2017 05:26    EKG:   Orders placed or performed during the hospital encounter of 06/14/17  . EKG 12-Lead  . EKG 12-Lead  . ED EKG  . ED EKG      Management plans discussed with the patient, family and they are in agreement.  CODE STATUS:     Code Status Orders  (From admission, onward)        Start     Ordered   06/14/17 0838  Full code  Continuous     06/14/17 0838    Code Status History    Date Active Date Inactive Code Status Order ID Comments User Context   This patient has a current code status but no historical code status.    Advance Directive Documentation     Most Recent Value  Type of Advance Directive  Healthcare Power of Attorney, Living will  Pre-existing out of facility DNR order (yellow form or pink MOST form)  No data  "MOST" Form in Place?  No data      TOTAL TIME TAKING CARE OF THIS PATIENT: 45 minutes.    Avel Peace Ahmiya Abee M.D on 06/14/2017 at 3:15 PM  Between 7am to 6pm - Pager - [phone]  After 6pm go to [email or web address] - password [email or web address] [facility]  Office  [phone]  CC: Primary care physician; Ann Maki, MD   Note: This dictation was prepared with Dragon dictation along with smaller phrase technology. Any transcriptional errors that result from this process are unintentional.

## 2017-06-14 NOTE — Progress Notes (Signed)
This RN notified that bed was ready at [facility] for transfer. Report called to accepting RN at [facility], [place] called for transportation, awaiting ETA, family aware of situation. No acute distress at this time.

## 2017-06-14 NOTE — Consult Note (Signed)
Cardiology Consultation Note    Patient ID: Evan Sanchez, MRN: [id number], DOB/AGE: 1940-04-30 77 y.o. Admit date: 06/14/2017   Date of Consult: 06/14/2017 Primary Physician: Ann Maki, MD Primary Cardiologist: [facility]  Chief Complaint: My pacemaker beeped Reason for Consultation: aicd lead impedance change Requesting MD: Dr. Jerelyn Charles  HPI: Evan Sanchez is a 77 y.o. male with history of atrial fibrillation and apparent cardiomyopathy with an AICD in place followed at the dural [facility].Marland Kitchen He is anticoagulated with Xarelto. He recently had his generator changed out in December 2018 and has been doing well since that time until early this morning when he and his wife heard his defibrillator alarm. This happened on 2 occasions. He did not receive an AICD shock. He was brought to the emergency room where interrogation of his device suggested that he had a increased RV lead impedance which occurred this morning. Interrogation of the device also showed what appeared to be the lead noise. Device is functioning normally at present. Impedance is back to 399 ohms. Manipulation of the site did not reproduce the episode. He is hemodynamically stable. He has no chest pain or shortness of breath. He denies any syncope or presyncope.  Past Medical History:  Diagnosis Date  . Atrial fibrillation ([place])   . CHF (congestive heart failure) ([place])     put on coumadin after had chf 3-4 yrs ago  . Current use of long term anticoagulation    Xarelto as of 11/22/2015  . Depression   . GERD (gastroesophageal reflux disease)    h/o acid reflux  . Heart murmur    as a child  . Hypertension    takes lisinopril - requested ekg, echo, stress tes t from [facility] clinic      Surgical History:  Past Surgical History:  Procedure Laterality Date  . ATRIAL FIBRILLATION ABLATION  2018  . CARDIAC DEFIBRILLATOR PLACEMENT  ? 2010  . CARDIAC DEFIBRILLATOR PLACEMENT    . EYE SURGERY     right cataract  removal  . INSERT / REPLACE / REMOVE PACEMAKER    . LUMBAR LAMINECTOMY/DECOMPRESSION MICRODISCECTOMY  03/04/2011   Procedure: LUMBAR LAMINECTOMY/DECOMPRESSION MICRODISCECTOMY;  Surgeon: [name];  Location: [place] [facility];  Service: Neurosurgery;  Laterality: Left;  LEFT Lumbar four-five LUMBAR MICRODISKECTOMY  . VASECTOMY       Home Meds: Prior to Admission medications   Medication Sig Start Date End Date Taking? Authorizing Provider  citalopram (CELEXA) 20 MG tablet Take 20 mg by mouth daily.     Yes [provider]  digoxin (LANOXIN) 0.125 MG tablet Take 125 mcg by mouth daily.     Yes [provider]  donepezil (ARICEPT) 5 MG tablet Take 5 mg by mouth at bedtime.  04/29/15  Yes [provider]  DULoxetine (CYMBALTA) 60 MG capsule Take 60 mg by mouth daily.  04/29/15  Yes [provider]  lisinopril (PRINIVIL,ZESTRIL) 5 MG tablet Take 5 mg by mouth daily.     Yes [provider]  losartan (COZAAR) 50 MG tablet Take 50 mg by mouth daily.  06/18/15  Yes [provider]  Multiple Vitamins-Minerals (MULTIVITAMINS THER. W/MINERALS) TABS Take 1 tablet by mouth daily.     Yes [provider]  nitroGLYCERIN (NITROSTAT) 0.4 MG SL tablet Place under the tongue.   Yes [provider]  Omega-3 Fatty Acids (FISH OIL) 1200 MG CAPS Take 1,200 mg by mouth daily.     Yes [provider]  rivaroxaban (XARELTO) 20 MG TABS tablet Take 20 mg by mouth daily with supper.  02/02/15  Yes [provider]  aspirin EC 81 MG tablet Take 81 mg by mouth daily.      [provider]  carvedilol (COREG) 25 MG tablet Take 25 mg by mouth 2 (two) times daily with a meal.      [provider]  furosemide (LASIX) 20 MG tablet Take 20 mg by mouth daily.      [provider]  gabapentin (NEURONTIN) 300 MG capsule Take 300 mg by mouth at bedtime.      [provider]  HYDROcodone-acetaminophen (NORCO) 5-325  MG per tablet Take 1 tablet by mouth every 6 (six) hours as needed. For pain      [provider]  simvastatin (ZOCOR) 40 MG tablet Take 40 mg by mouth at bedtime.      [provider]  warfarin (COUMADIN) 3 MG tablet Take 3 mg by mouth daily.      [provider]    Inpatient Medications:  . aspirin EC  81 mg Oral Daily  . carvedilol  25 mg Oral BID WC  . citalopram  20 mg Oral Daily  . digoxin  125 mcg Oral Daily  . furosemide  20 mg Oral Daily  . gabapentin  300 mg Oral QHS  . lisinopril  5 mg Oral Daily  . rivaroxaban  20 mg Oral Q supper  . simvastatin  40 mg Oral QHS     Allergies: No Known Allergies  Social History   Socioeconomic History  . Marital status: Married    Spouse name: Not on file  . Number of children: Not on file  . Years of education: Not on file  . Highest education level: Not on file  Social Needs  . Financial resource strain: Not on file  . Food insecurity - worry: Not on file  . Food insecurity - inability: Not on file  . Transportation needs - medical: Not on file  . Transportation needs - non-medical: Not on file  Occupational History  . Not on file  Tobacco Use  . Smoking status: Former Smoker    Packs/day: 2.00    Years: 25.00    Pack years: 50.00    Types: Cigarettes    Last attempt to quit: 02/28/1976    Years since quitting: 41.3  . Smokeless tobacco: Never Used  Substance and Sexual Activity  . Alcohol use: Yes    Alcohol/week: 8.4 oz    Types: 14 Glasses of wine per week  . Drug use: No  . Sexual activity: Not on file  Other Topics Concern  . Not on file  Social History Narrative  . Not on file     Family History  Problem Relation Age of Onset  . Heart failure Father   . Heart attack Father   . Prostate cancer Father   . Pneumonia Mother      Review of Systems: A 12-system review of systems was performed and is negative except as noted in the HPI.  Labs: Recent Labs    06/14/17 0509   TROPONINI <0.03   Lab Results  Component Value Date   WBC 7.8 06/14/2017   HGB 13.5 06/14/2017   HCT 40.0 06/14/2017   MCV 95.2 06/14/2017   PLT 284 06/14/2017    Recent Labs  Lab 06/14/17 0509  NA 138  K 3.7  CL 102  CO2 25  BUN 12  CREATININE 0.83  CALCIUM 8.6*  PROT 7.0  BILITOT 0.7  ALKPHOS 73  ALT 51  AST 65*  GLUCOSE 120*   No results found for: CHOL, HDL, LDLCALC, TRIG No results found for: DDIMER  Radiology/Studies:  Dg Chest Portable 1 View  Result Date: 06/14/2017 CLINICAL DATA:  Chest pain.  Defibrillator fired. EXAM: PORTABLE CHEST 1 VIEW COMPARISON:  02/28/2011 FINDINGS: Left-sided pacemaker in place with leads overlying the right ventricle. Unchanged heart size and mediastinal contours with borderline cardiomegaly. No pulmonary edema, focal airspace disease, large pleural effusion or pneumothorax. Degenerative change of the right shoulder. IMPRESSION: No acute abnormality.  Left-sided pacemaker in place. Electronically Signed   By: Jeb Levering M.D.   On: 06/14/2017 05:26    Wt Readings from Last 3 Encounters:  06/14/17 93 kg (205 lb)  11/22/15 95.3 kg (210 lb)  08/07/15 97.5 kg (215 lb)    EKG: Atrial fibrillation with controlled ventricular response. No ischemic changes.  Physical Exam:  Blood pressure 122/74, pulse 87, temperature 98.6 F (37 C), temperature source Oral, resp. rate 20, height 5\' 11"  (1.803 m), weight 93 kg (205 lb), SpO2 98 %. Body mass index is 28.59 kg/m. General: Well developed, well nourished, in no acute distress. Head: Normocephalic, atraumatic, sclera non-icteric, no xanthomas, nares are without discharge.  Neck: Negative for carotid bruits. JVD not elevated. Lungs: Clear bilaterally to auscultation without wheezes, rales, or rhonchi. Breathing is unlabored. Heart: Irregular irregular with S1 S2. No murmurs, rubs, or gallops appreciated. Abdomen: Soft, non-tender, non-distended with normoactive bowel sounds. No  hepatomegaly. No rebound/guarding. No obvious abdominal masses. Msk:  Strength and tone appear normal for age. Extremities: No clubbing or cyanosis. No edema.  Distal pedal pulses are 2+ and equal bilaterally. Neuro: Alert and oriented X 3. No facial asymmetry. No focal deficit. Moves all extremities spontaneously. Psych:  Responds to questions appropriately with a normal affect.     Assessment and Plan  Patient is a 77 year old male with history of ischemic cardio myopathy as well as atrial fibrillation with an AICD in place for history of ventricular tachycardia. He recently had the generator changed out. This was done in December 2018. Device was working normally and was interrogated at the [place] yesterday and apparently functioning normally. This morning he awoke with the sound of the defibrillator alarming. He received no shocks. This occurred on 2 occasions he presented to the emergency room where interrogation of the device via [facility] suggested a increase in the lead impedance in the RV lead. Further interrogation suggested noise in the RV lead. Chest x-ray did not show obvious problems at header of the device. Interrogation of the device in the patient's room shows that the lead impedances returned back to normal. Pressing on the header did not reproduce the impedance change. This likely suggest either loose header attachment of the RV lead versus RV lead fracture. Concern over possible RV lead fracture which will require possible extraction therefore would recommend transfer back to the dural [facility] or [facility] for reevaluation of the header site. This would be in case that lead extraction would need to be carried out which is not possible here.  Signed, Teodoro Spray MD 06/14/2017, 1:51 PM Pager: [phone]

## 2017-06-14 NOTE — ED Triage Notes (Signed)
Pt states he "heard" his defibrillator go off this am x 2. States he has not had any symptoms, did not feel it states just sounded like a bell.

## 2017-06-14 NOTE — H&P (Signed)
[place] [facility] [place] NAME: Evan Sanchez    MR#:  [id number]  DATE OF BIRTH:  [birth year]  DATE OF ADMISSION:  06/14/2017  PRIMARY CARE PHYSICIAN: Ann Maki, MD   REQUESTING/REFERRING PHYSICIAN: Owens Shark, MD  CHIEF COMPLAINT:   Chief Complaint  Patient presents with  . Chest Pain    HISTORY OF PRESENT ILLNESS:  Evan Sanchez  is a 77 y.o. male who presents with defibrillator alert.  Patient states that he was sleeping when he heard a beeping sound.  His wife states that she also heard her and thought his defibrillator had fired.  Patient states he did not feel his defibrillator fired any point.  When they heard the sound again decided to come in for evaluation.  Interrogation here initially shows possible lead malfunction.  Hospitalist called for admission with cardiology consult.  PAST MEDICAL HISTORY:   Past Medical History:  Diagnosis Date  . Atrial fibrillation ([place])   . CHF (congestive heart failure) ([place])     put on coumadin after had chf 3-4 yrs ago  . Current use of long term anticoagulation    Xarelto as of 11/22/2015  . Depression   . GERD (gastroesophageal reflux disease)    h/o acid reflux  . Heart murmur    as a child  . Hypertension    takes lisinopril - requested ekg, echo, stress tes t from [facility] clinic    PAST SURGICAL HISTORY:   Past Surgical History:  Procedure Laterality Date  . CARDIAC DEFIBRILLATOR PLACEMENT  ? 2010  . CARDIAC DEFIBRILLATOR PLACEMENT    . EYE SURGERY     right cataract removal  . INSERT / REPLACE / REMOVE PACEMAKER    . LUMBAR LAMINECTOMY/DECOMPRESSION MICRODISCECTOMY  03/04/2011   Procedure: LUMBAR LAMINECTOMY/DECOMPRESSION MICRODISCECTOMY;  Surgeon: [name];  Location: [place] [facility];  Service: Neurosurgery;  Laterality: Left;  LEFT Lumbar four-five LUMBAR MICRODISKECTOMY  . VASECTOMY      SOCIAL HISTORY:   Social History   Tobacco Use  . Smoking status:  Former Smoker    Packs/day: 2.00    Years: 25.00    Pack years: 50.00    Types: Cigarettes    Last attempt to quit: 02/28/1976    Years since quitting: 41.3  . Smokeless tobacco: Never Used  Substance Use Topics  . Alcohol use: Yes    Alcohol/week: 2.4 oz    Types: 3 Glasses of wine, 1 Cans of beer per week    FAMILY HISTORY:   Family History  Problem Relation Age of Onset  . Heart failure Father   . Heart attack Father   . Prostate cancer Father   . Pneumonia Mother     DRUG ALLERGIES:  No Known Allergies  MEDICATIONS AT HOME:   Prior to Admission medications   Medication Sig Start Date End Date Taking? Authorizing Provider  aspirin EC 81 MG tablet Take 81 mg by mouth daily.      [provider]  carvedilol (COREG) 25 MG tablet Take 25 mg by mouth 2 (two) times daily with a meal.      [provider]  citalopram (CELEXA) 20 MG tablet Take 20 mg by mouth daily.      [provider]  digoxin (LANOXIN) 0.125 MG tablet Take 125 mcg by mouth daily.      [provider]  furosemide (LASIX) 20 MG tablet Take 20 mg by mouth daily.  [provider]  gabapentin (NEURONTIN) 300 MG capsule Take 300 mg by mouth at bedtime.      [provider]  HYDROcodone-acetaminophen (NORCO) 5-325 MG per tablet Take 1 tablet by mouth every 6 (six) hours as needed. For pain      [provider]  lisinopril (PRINIVIL,ZESTRIL) 5 MG tablet Take 5 mg by mouth daily.      [provider]  Multiple Vitamins-Minerals (MULTIVITAMINS THER. W/MINERALS) TABS Take 1 tablet by mouth daily.      [provider]  Omega-3 Fatty Acids (FISH OIL) 1200 MG CAPS Take 1,200 mg by mouth daily.      [provider]  simvastatin (ZOCOR) 40 MG tablet Take 40 mg by mouth at bedtime.      [provider]  warfarin (COUMADIN) 3 MG tablet Take 3 mg by mouth daily.      [provider]    REVIEW OF SYSTEMS:  Review of  Systems  Constitutional: Negative for chills, fever, malaise/fatigue and weight loss.  HENT: Negative for ear pain, hearing loss and tinnitus.   Eyes: Negative for blurred vision, double vision, pain and redness.  Respiratory: Negative for cough, hemoptysis and shortness of breath.   Cardiovascular: Negative for chest pain, palpitations, orthopnea and leg swelling.  Gastrointestinal: Negative for abdominal pain, constipation, diarrhea, nausea and vomiting.  Genitourinary: Negative for dysuria, frequency and hematuria.  Musculoskeletal: Negative for back pain, joint pain and neck pain.  Skin:       No acne, rash, or lesions  Neurological: Negative for dizziness, tremors, focal weakness and weakness.  Endo/Heme/Allergies: Negative for polydipsia. Does not bruise/bleed easily.  Psychiatric/Behavioral: Negative for depression. The patient is not nervous/anxious and does not have insomnia.      VITAL SIGNS:   Vitals:   06/14/17 0504 06/14/17 0515  BP:  121/89  Pulse:  (!) 104  Resp:  18  SpO2:  100%  Weight: 93 kg (205 lb)   Height: 5\' 11"  (1.803 m)    Wt Readings from Last 3 Encounters:  06/14/17 93 kg (205 lb)  11/22/15 95.3 kg (210 lb)  08/07/15 97.5 kg (215 lb)    PHYSICAL EXAMINATION:  Physical Exam  Vitals reviewed. Constitutional: He is oriented to person, place, and time. He appears well-developed and well-nourished. No distress.  HENT:  Head: Normocephalic and atraumatic.  Mouth/Throat: Oropharynx is clear and moist.  Eyes: Conjunctivae and EOM are normal. Pupils are equal, round, and reactive to light. No scleral icterus.  Neck: Normal range of motion. Neck supple. No JVD present. No thyromegaly present.  Cardiovascular: Normal rate and intact distal pulses. Exam reveals no gallop and no friction rub.  No murmur heard. Irregular rhythm  Respiratory: Effort normal and breath sounds normal. No respiratory distress. He has no wheezes. He has no rales.  GI: Soft. Bowel  sounds are normal. He exhibits no distension. There is no tenderness.  Musculoskeletal: Normal range of motion. He exhibits no edema.  No arthritis, no gout  Lymphadenopathy:    He has no cervical adenopathy.  Neurological: He is alert and oriented to person, place, and time. No cranial nerve deficit.  No dysarthria, no aphasia  Skin: Skin is warm and dry. No rash noted. No erythema.  Psychiatric: He has a normal mood and affect. His behavior is normal. Judgment and thought content normal.    LABORATORY PANEL:   CBC Recent Labs  Lab 06/14/17 0509  WBC 7.8  HGB 13.5  HCT  40.0  PLT 284   ------------------------------------------------------------------------------------------------------------------  Chemistries  Recent Labs  Lab 06/14/17 0509  NA 138  K 3.7  CL 102  CO2 25  GLUCOSE 120*  BUN 12  CREATININE 0.83  CALCIUM 8.6*  AST 65*  ALT 51  ALKPHOS 73  BILITOT 0.7   ------------------------------------------------------------------------------------------------------------------  Cardiac Enzymes Recent Labs  Lab 06/14/17 0509  TROPONINI <0.03   ------------------------------------------------------------------------------------------------------------------  RADIOLOGY:  Dg Chest Portable 1 View  Result Date: 06/14/2017 CLINICAL DATA:  Chest pain.  Defibrillator fired. EXAM: PORTABLE CHEST 1 VIEW COMPARISON:  02/28/2011 FINDINGS: Left-sided pacemaker in place with leads overlying the right ventricle. Unchanged heart size and mediastinal contours with borderline cardiomegaly. No pulmonary edema, focal airspace disease, large pleural effusion or pneumothorax. Degenerative change of the right shoulder. IMPRESSION: No acute abnormality.  Left-sided pacemaker in place. Electronically Signed   By: Jeb Levering M.D.   On: 06/14/2017 05:26    EKG:   Orders placed or performed during the hospital encounter of 06/14/17  . EKG 12-Lead  . EKG 12-Lead  . ED EKG   . ED EKG    IMPRESSION AND PLAN:  Principal Problem:   Complication due to implantable cardioverter-defibrillator (ICD) -patient's care has been at the [facility] for the recent past.  He was seen previously here by Dr. Saralyn Pilar, so we will consult him.  Patient states he had his original device put in here years ago, and several months ago had it replaced at the [place]. Active Problems:   HTN (hypertension) -continue home meds   CAD (coronary artery disease) -new home medications   Atrial fibrillation, chronic (HCC) -continue home meds including anticoagulation   HLD (hyperlipidemia) -home dose anti-lipids  Of note, patient did not have an accurate medication list with him.  His wife states that she will bring this in for proper medication reconciliation.  All the records are reviewed and case discussed with ED provider. Management plans discussed with the patient and/or family.  DVT PROPHYLAXIS: Systemic anticoagulation  GI PROPHYLAXIS: None  ADMISSION STATUS: Observation  CODE STATUS: Full Code Status History    This patient does not have a recorded code status. Please follow your organizational policy for patients in this situation.    Advance Directive Documentation     Most Recent Value  Type of Advance Directive  Living will, Healthcare Power of Attorney  Pre-existing out of facility DNR order (yellow form or pink MOST form)  No data  "MOST" Form in Place?  No data      TOTAL TIME TAKING CARE OF THIS PATIENT: 40 minutes.   Aundria Bitterman FIELDING 06/14/2017, 6:18 AM  [employer]  [phone]  CC: Primary care physician; Ann Maki, MD  Note:  This document was prepared using Dragon voice recognition software and may include unintentional dictation errors.

## 2017-06-28 ENCOUNTER — Other Ambulatory Visit: Payer: Self-pay | Admitting: *Deleted

## 2017-06-28 NOTE — Patient Outreach (Signed)
[facility]) Care Management  06/28/2017  Evan Sanchez [birth year] [id number]  Evan Sanchez is a 77 year old male patient who primary care provider is Dr. Ammie Dalton @ [facility] in [place]. Evan Sanchez has a medical history which includes: atrial fibrillation with AICD with recent complications and subsequent hospitalization and discharge on 06/14/17, long term anticoagulation use, CHF (congestive heart failure), depression, GERD (gastroesophageal reflux disease), HTN (hypertension), CAD (coronary artery disease), and HLD (hyperlipidemia).  Evan Sanchez was referred to [facility] by his health plan subsequent to his discharge on 06/14/17 from [place] for transition of care assessment.   Plan: I will reach out to Evan Sanchez by phone on 06/29/17.    [facility]  [phone]

## 2017-06-29 ENCOUNTER — Other Ambulatory Visit: Payer: Self-pay | Admitting: *Deleted

## 2017-06-29 NOTE — Patient Outreach (Addendum)
[facility]) Care Management  06/29/2017  MICKEL SCHREUR 10/06/1940 [id number]   Mr. KASTON FAUGHN is a 77 year old male patient who primary care provider is Dr. Ammie Dalton @ [facility] in [place]. Mr. Hoecker has a medical history which includes: atrial fibrillation with AICD with recent complications and subsequent hospitalization and discharge on 06/14/17, long term anticoagulation use, CHF (congestive heart failure), depression, GERD (gastroesophageal reflux disease), HTN (hypertension), CAD (coronary artery disease), and HLD (hyperlipidemia).  Mr. Cutting was referred to [facility] by his health plan subsequent to his discharge on 06/14/17 from [facility] for transition of care assessment. I reached out to the home of Mr. Brotz today and was able to speak with his wife Mrs. Elpidio Anis. Mrs. Pamala Hurry indicated that Mr. Barmore is resting.   Afib and Heart Failure Management - Mrs.Pamala Hurry relates that Mr. Barber originally presented to [facility] when he was awakened at 3am after his AICD fired "at least twice". His wife called the [place] line as Mr. Sammon typically receives all care at the [facility] in [name]. Mrs. Pamala Hurry says her husband was transferred to [facility] so that he could receive further treatment. She states that his AICD was originally placed on 03/28/2017 and that Mr. Wuellner has been in and out of afib for the last several months. Mrs. Pamala Hurry also relates that she has been helping Mr. Sagona monitor his weight and blood pressure and is able to relate that Mr. Yankowski is to take an extra dose of his Lasix 40mg  if his weight increases 3# overnight or 5# in a week. His recent weights have been:  06/29/17: 205.4lb 06/28/17: 206 lb 06/27/17: 204lb  Mrs. Pamala Hurry indicates that Mr. Dowlen currently has "very mild" swelling of his abdomen, no swelling in  the extremities, and is not short of breath and does not complain of other cardiovascular symptoms.   Medication Management - Mrs. Fogleman manages and administers Mr. Hegwood's medications daily. Mr. Keadle is a patron of [facility] in [place], [place]. Mrs. Fogleman agreed to review Mr. Langlois's medications with me today in detail.   DME Needs - Mrs. Pamala Hurry states that Mr. Mazariego suffers from incontinence and asks today if I can help her find out if either of Mr. Whang's insurance benefits might help cover the cost of these supplies. I reached out to the insurance representative on behalf of Mrs. Pamala Hurry and Mr. Lance who stated she would send the "OTC Catalog" to the home of Mr. Lemberger where he and his wife can review any discounted prices for incontinence supplies.  Plan:   I will outreach to patient/spouse in one week, after PCP appointment.   [facility] CM Care Plan Problem One     Most Recent Value  Care Plan Problem One  Knowledge Deficits related to plan of care for addressing AICD revision  Role Documenting the Problem One  Care Management [place] for Problem One  Active  [facility] Long Term Goal   Over the next 15 days, patient's spouse/caregiver will verbalize understanding of plan of care for management of afib and AICD revision  [facility] Long Term Goal Start Date  06/29/17  Interventions for Problem One Long Term Goal  reviewed discharge instructions and plan for return to PCP for further discussion re: AICD revision and plan of care for treatment of afib  [facility] CM Short Term Goal #1   Over the  next 7 days, patient will attend scheduled primary care provider appointment  [facility] CM Short Term Goal #1 Start Date  06/29/17  Interventions for Short Term Goal #1  reviewed date of upcoming PCP appointment and encouraged patient's spouse to inquire with provider re: any new problems    [facility] CM Care Plan Problem Two     Most Recent Value  Care Plan Problem Two  Knowledge  Deficits re: DME benefit coverage  Role Documenting the Problem Two  Care Management [place] for Problem Two  Active  [facility] CM Short Term Goal #1   Over the next 48 hours, patient's spouse will verbalize understanding of benefits/coverage for incontinence supplies  [facility] CM Short Term Goal #1 Start Date  06/29/17  Interventions for Short Term Goal #2   outreach to [place] and Insurance benefits specialist to inquire on patient behalf re: incontinence supply coverage/benefit       [place] [facility]  [phone]

## 2017-12-21 ENCOUNTER — Emergency Department
Admission: EM | Admit: 2017-12-21 | Discharge: 2017-12-21 | Disposition: A | Discharge status: Home / Self Care | Payer: Medicare HMO | Attending: Emergency Medicine | Admitting: Emergency Medicine

## 2017-12-21 ENCOUNTER — Encounter: Payer: Self-pay | Admitting: *Deleted

## 2017-12-21 ENCOUNTER — Other Ambulatory Visit: Payer: Self-pay

## 2017-12-21 ENCOUNTER — Emergency Department: Payer: Medicare HMO

## 2017-12-21 DIAGNOSIS — Z87891 Personal history of nicotine dependence: Secondary | ICD-10-CM | POA: Insufficient documentation

## 2017-12-21 DIAGNOSIS — R42 Dizziness and giddiness: Secondary | ICD-10-CM

## 2017-12-21 DIAGNOSIS — I110 Hypertensive heart disease with heart failure: Secondary | ICD-10-CM | POA: Diagnosis not present

## 2017-12-21 DIAGNOSIS — Z79899 Other long term (current) drug therapy: Secondary | ICD-10-CM | POA: Insufficient documentation

## 2017-12-21 DIAGNOSIS — I509 Heart failure, unspecified: Secondary | ICD-10-CM | POA: Diagnosis not present

## 2017-12-21 DIAGNOSIS — Z7901 Long term (current) use of anticoagulants: Secondary | ICD-10-CM | POA: Diagnosis not present

## 2017-12-21 DIAGNOSIS — Z950 Presence of cardiac pacemaker: Secondary | ICD-10-CM | POA: Insufficient documentation

## 2017-12-21 LAB — URINALYSIS, COMPLETE (UACMP) WITH MICROSCOPIC
Bilirubin Urine: NEGATIVE
Glucose, UA: NEGATIVE mg/dL
Hgb urine dipstick: NEGATIVE
KETONES UR: NEGATIVE mg/dL
Nitrite: NEGATIVE
PH: 5 (ref 5.0–8.0)
Protein, ur: NEGATIVE mg/dL
SPECIFIC GRAVITY, URINE: 1.013 (ref 1.005–1.030)
SQUAMOUS EPITHELIAL / LPF: NONE SEEN (ref 0–5)

## 2017-12-21 LAB — CBC
HCT: 36.9 % — ABNORMAL LOW (ref 40.0–52.0)
Hemoglobin: 12.5 g/dL — ABNORMAL LOW (ref 13.0–18.0)
MCH: 32.1 pg (ref 26.0–34.0)
MCHC: 33.9 g/dL (ref 32.0–36.0)
MCV: 94.6 fL (ref 80.0–100.0)
PLATELETS: 266 10*3/uL (ref 150–440)
RBC: 3.9 MIL/uL — ABNORMAL LOW (ref 4.40–5.90)
RDW: 14.9 % — AB (ref 11.5–14.5)
WBC: 9.5 10*3/uL (ref 3.8–10.6)

## 2017-12-21 LAB — BASIC METABOLIC PANEL
Anion gap: 6 (ref 5–15)
BUN: 16 mg/dL (ref 8–23)
CALCIUM: 8.7 mg/dL — AB (ref 8.9–10.3)
CO2: 29 mmol/L (ref 22–32)
CREATININE: 1.16 mg/dL (ref 0.61–1.24)
Chloride: 101 mmol/L (ref 98–111)
GFR calc Af Amer: >60 mL/min (ref >60)
GFR, EST NON AFRICAN AMERICAN: 58 mL/min — AB (ref >60)
Glucose, Bld: 111 mg/dL — ABNORMAL HIGH (ref 70–99)
Potassium: 3.9 mmol/L (ref 3.5–5.1)
SODIUM: 136 mmol/L (ref 135–145)

## 2017-12-21 LAB — DIGOXIN LEVEL

## 2017-12-21 LAB — TROPONIN I

## 2017-12-21 MED ORDER — FOSFOMYCIN TROMETHAMINE 3 G PO PACK
3.0000 g | PACK | Freq: Once | ORAL | Status: AC
  Administered 2017-12-21: 3 g via ORAL
  Filled 2017-12-21: qty 3

## 2017-12-21 MED ORDER — SODIUM CHLORIDE 0.9 % IV BOLUS
500.0000 mL | Freq: Once | INTRAVENOUS | Status: AC
  Administered 2017-12-21: 500 mL via INTRAVENOUS

## 2017-12-21 NOTE — ED Provider Notes (Signed)
[facility] Emergency Department Provider Note  ____________________________________________   I have reviewed the triage vital signs and the nursing notes. Where available I have reviewed prior notes and, if possible and indicated, outside hospital notes.    HISTORY  Chief Complaint Dizziness    HPI Evan Sanchez is a 77 y.o. male  Presents here today complaining of feeling a little bit lightheaded since his cardioversion last Monday.  He woke up on Tuesday and felt he has been lightheaded.  He denies any chest pain shortness breath nausea or vomiting.  He states he has not thrown up.  He is eating and drinking okay.  No melena no bright red blood per rectum.  He is on blood thinners.  Patient does take Coreg, I have asked our pharmacy tech to upgrade his med list, he has had no change in his medications but he is no longer in atrial fibrillation. Takes digitalis.  His cardioversion was done at the [place],    Past Medical History:  Diagnosis Date  . Atrial fibrillation ([place])   . CHF (congestive heart failure) ([place])     put on coumadin after had chf 3-4 yrs ago  . Current use of long term anticoagulation    Xarelto as of 11/22/2015  . Depression   . GERD (gastroesophageal reflux disease)    h/o acid reflux  . Heart murmur    as a child  . Hypertension    takes lisinopril - requested ekg, echo, stress tes t from [facility] clinic    Patient Active Problem List   Diagnosis Date Noted  . HTN (hypertension) 06/14/2017  . GERD (gastroesophageal reflux disease) 06/14/2017  . CAD (coronary artery disease) 06/14/2017  . Atrial fibrillation, chronic ([place]) 06/14/2017  . HLD (hyperlipidemia) 06/14/2017  . 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 ATRIAL FIBRILLATION ABLATION  2018  . CARDIAC DEFIBRILLATOR PLACEMENT  ? 2010  . CARDIAC DEFIBRILLATOR PLACEMENT    . EYE  SURGERY     right cataract removal  . INSERT / REPLACE / REMOVE PACEMAKER    . LUMBAR LAMINECTOMY/DECOMPRESSION MICRODISCECTOMY  03/04/2011   Procedure: LUMBAR LAMINECTOMY/DECOMPRESSION MICRODISCECTOMY;  Surgeon: [name];  Location: [facility] NEURO ORS;  Service: Neurosurgery;  Laterality: Left;  LEFT Lumbar four-five LUMBAR MICRODISKECTOMY  . VASECTOMY      Prior to Admission medications   Medication Sig Start Date End Date Taking? Authorizing Provider  carvedilol (COREG) 25 MG tablet Take 25 mg by mouth 2 (two) times daily with a meal.     Yes [provider]  citalopram (CELEXA) 20 MG tablet Take 20 mg by mouth daily.     Yes [provider]  digoxin (LANOXIN) 0.125 MG tablet Take 125 mcg by mouth daily.     Yes [provider]  donepezil (ARICEPT) 5 MG tablet Take 5 mg by mouth at bedtime.  04/29/15  Yes [provider]  DULoxetine (CYMBALTA) 60 MG capsule Take 60 mg by mouth at bedtime.    Yes [provider]  furosemide (LASIX) 40 MG tablet Take 40-80 mg by mouth daily. Depending on degree of swelling   Yes [provider]  lisinopril (PRINIVIL,ZESTRIL) 5 MG tablet Take 5 mg by mouth daily.     Yes [provider]  losartan (COZAAR) 50 MG tablet Take 50 mg by mouth daily.  06/18/15  Yes [provider]  nitroGLYCERIN (NITROSTAT) 0.4 MG  SL tablet Place 0.4 mg under the tongue every 5 (five) minutes as needed for chest pain.    Yes [provider]  NON FORMULARY Take 2-3 tablets by mouth See admin instructions. Prescription multivitamin filled at the [facility] 3 tablets daily with lunch and 2 tablets daily at bedtime   Yes [provider]  Omega-3 Fatty Acids (FISH OIL) 1200 MG CAPS Take 1,200 mg by mouth daily with lunch.    Yes [provider]  rivaroxaban (XARELTO) 20 MG TABS tablet Take 20 mg by mouth daily with supper.  02/02/15  Yes [provider]  simvastatin (ZOCOR) 40 MG tablet Take  40 mg by mouth at bedtime.     Yes [provider]    Allergies Patient has no known allergies.  Family History  Problem Relation Age of Onset  . Heart failure Father   . Heart attack Father   . Prostate cancer Father   . Pneumonia Mother     Social History Social History   Tobacco Use  . Smoking status: Former Smoker    Packs/day: 2.00    Years: 25.00    Pack years: 50.00    Types: Cigarettes    Last attempt to quit: 02/28/1976    Years since quitting: 41.8  . Smokeless tobacco: Never Used  Substance Use Topics  . Alcohol use: Yes    Alcohol/week: 14.0 standard drinks    Types: 14 Glasses of wine per week  . Drug use: No    Review of Systems Constitutional: No fever/chills Eyes: No visual changes. ENT: No sore throat. No stiff neck no neck pain Cardiovascular: Denies chest pain. Respiratory: Denies shortness of breath. Gastrointestinal:   no vomiting.  No diarrhea.  No constipation. Genitourinary: Negative for dysuria. Musculoskeletal: Negative lower extremity swelling Skin: Negative for rash. Neurological: Negative for severe headaches, focal weakness or numbness.   ____________________________________________   PHYSICAL EXAM:  VITAL SIGNS: ED Triage Vitals  Enc Vitals Group     BP 12/21/17 1744 119/70     Pulse Rate 12/21/17 1744 (!) 50     Resp 12/21/17 1744 20     Temp --      Temp Source 12/21/17 1744 Oral     SpO2 12/21/17 1744 96 %     Weight 12/21/17 1745 (!) 1 lb 2 oz (0.51 kg)     Height 12/21/17 1745 5\' 11"  (1.803 m)     Head Circumference --      Peak Flow --      Pain Score 12/21/17 1745 0     Pain Loc --      Pain Edu? --      Excl. in [place]? --     Constitutional: Alert and oriented. Well appearing and in no acute distress. Eyes: Conjunctivae are normal Head: Atraumatic HEENT: No congestion/rhinnorhea. Mucous membranes are moist.  Oropharynx non-erythematous Neck:   Nontender with no meningismus, no masses, no  stridor Cardiovascular: Bradycardia  noted regular rhythm. Grossly normal heart sounds.  Good peripheral circulation. Respiratory: Normal respiratory effort.  No retractions. Lungs CTAB. Abdominal: Soft and nontender. No distention. No guarding no rebound Back:  There is no focal tenderness or step off.  there is no midline tenderness there are no lesions noted. there is no CVA tenderness Musculoskeletal: No lower extremity tenderness, no upper extremity tenderness. No joint effusions, no DVT signs strong distal pulses no edema Neurologic:  Normal speech and language. No gross focal neurologic deficits are appreciated.  Skin:  Skin is warm, dry and intact. No rash noted. Psychiatric: Mood and affect are normal. Speech and behavior are normal.  ____________________________________________   LABS (all labs ordered are listed, but only abnormal results are displayed)  Labs Reviewed  BASIC METABOLIC PANEL - Abnormal; Notable for the following components:      Result Value   Glucose, Bld 111 (*)    Calcium 8.7 (*)    GFR calc non Af Amer 58 (*)    All other components within normal limits  CBC - Abnormal; Notable for the following components:   RBC 3.90 (*)    Hemoglobin 12.5 (*)    HCT 36.9 (*)    RDW 14.9 (*)    All other components within normal limits  DIGOXIN LEVEL - Abnormal; Notable for the following components:   Digoxin Level <0.2 (*)    All other components within normal limits  TROPONIN I  URINALYSIS, COMPLETE (UACMP) WITH MICROSCOPIC    Pertinent labs  results that were available during my care of the patient were reviewed by me and considered in my medical decision making (see chart for details). ____________________________________________  EKG  I personally interpreted any EKGs ordered by me or triage Sinus bradycardia, rate 49 bpm no acute ST elevation or depression nonspecific ST changes ____________________________________________  RADIOLOGY  Pertinent labs  & imaging results that were available during my care of the patient were reviewed by me and considered in my medical decision making (see chart for details). If possible, patient and/or family made aware of any abnormal findings.  No results found. ____________________________________________    PROCEDURES  Procedure(s) performed: None  Procedures  Critical Care performed: None  ____________________________________________   INITIAL IMPRESSION / ASSESSMENT AND PLAN / ED COURSE  Pertinent labs & imaging results that were available during my care of the patient were reviewed by me and considered in my medical decision making (see chart for details).  Feels a little weak generally speaking after having a cardioversion.  He has no focal numbness or weakness no headache, no cerebellar signs, he just feels a little bit lightheaded and somewhat weak.  It was noted that his heart rate was low and so he was brought in.  Patient is on Coreg, as well as digoxin.  Digoxin levels are normal.  EKG shows bradycardia he is not orthostatic, he is not focally number week.  He feels "pretty good".  I did give him a little bit of IV fluid.  He does remain bradycardic here which I suspect is secondary to beta-blockade after cardioversion.  He is followed by [facility] cardiology.  We will discuss with them, at this time I do not see any indication for admission.  Work-up is quite reassuring evidence of UTI no evidence of CVA cerebellar otherwise, no evidence of GI bleed no evidence of digoxin toxicity, he is eager to go home and feels "fine" after a small bolus of fluid.  I discussed with Dr. Ubaldo Glassing, of his cardiology service.  He agrees with having the patient take half of his Coreg this evening and half tomorrow morning, and we will have him follow closely with his cardiologist tomorrow.  urinaysis is pending, if that is negative we will discharge    ____________________________________________   FINAL CLINICAL  IMPRESSION(S) / ED DIAGNOSES  Final diagnoses:  None      This chart was dictated using voice recognition software.  Despite best efforts to proofread,  errors can occur which can change meaning.  Schuyler Amor, MD 12/21/17 2024

## 2017-12-21 NOTE — Discharge Instructions (Signed)
I would recommend that you take half of your Coreg tonight and half of it tomorrow morning and call your cardiologist first thing in the morning.  If you have numbness weakness headache fever chills lightheadedness, feeling that you might fall, or any other new or worrisome symptoms return to the emergency department.

## 2017-12-21 NOTE — ED Triage Notes (Signed)
Pt to triage via wheelchair.  Pt reports feeling dizzy and nauseated.  No chest pain or sob.  Pt was cardioverted this week at the [facility] hospital.  Pt alert

## 2017-12-21 NOTE — ED Notes (Signed)
Pt to XR. ABCs intact. NAD 

## 2017-12-21 NOTE — ED Notes (Signed)
Per McShane MD only give 232mL of 568mL bolus then do orthostatics.

## 2017-12-21 NOTE — ED Provider Notes (Signed)
-----------------------------------------   9:51 PM on 12/21/2017 -----------------------------------------  Urinalysis indicates a few leukocytes and some rare bacteria, nitrate negative, no indication of gross infection.  I ordered a urine culture added on and ordered a single dose of fosfomycin 3 g p.o.  to treat what may be a mild UTI.     Hinda Kehr, MD 12/21/17 2151

## 2017-12-24 LAB — URINE CULTURE: Special Requests: NORMAL

## 2018-06-28 ENCOUNTER — Emergency Department: Payer: Medicare HMO

## 2018-06-28 ENCOUNTER — Emergency Department
Admission: EM | Admit: 2018-06-28 | Discharge: 2018-06-28 | Disposition: A | Discharge status: Home / Self Care | Payer: Medicare HMO | Attending: Student in an Organized Health Care Education/Training Program | Admitting: Student in an Organized Health Care Education/Training Program

## 2018-06-28 ENCOUNTER — Encounter: Payer: Self-pay | Admitting: Emergency Medicine

## 2018-06-28 ENCOUNTER — Other Ambulatory Visit: Payer: Self-pay

## 2018-06-28 DIAGNOSIS — I110 Hypertensive heart disease with heart failure: Secondary | ICD-10-CM | POA: Insufficient documentation

## 2018-06-28 DIAGNOSIS — I509 Heart failure, unspecified: Secondary | ICD-10-CM | POA: Diagnosis not present

## 2018-06-28 DIAGNOSIS — I4891 Unspecified atrial fibrillation: Secondary | ICD-10-CM | POA: Diagnosis not present

## 2018-06-28 DIAGNOSIS — Z79899 Other long term (current) drug therapy: Secondary | ICD-10-CM | POA: Insufficient documentation

## 2018-06-28 DIAGNOSIS — Z7901 Long term (current) use of anticoagulants: Secondary | ICD-10-CM | POA: Insufficient documentation

## 2018-06-28 DIAGNOSIS — R609 Edema, unspecified: Secondary | ICD-10-CM

## 2018-06-28 DIAGNOSIS — Z87891 Personal history of nicotine dependence: Secondary | ICD-10-CM | POA: Insufficient documentation

## 2018-06-28 DIAGNOSIS — R600 Localized edema: Secondary | ICD-10-CM | POA: Diagnosis present

## 2018-06-28 LAB — CBC
HCT: 37 % — ABNORMAL LOW (ref 39.0–52.0)
Hemoglobin: 11.7 g/dL — ABNORMAL LOW (ref 13.0–17.0)
MCH: 31.1 pg (ref 26.0–34.0)
MCHC: 31.6 g/dL (ref 30.0–36.0)
MCV: 98.4 fL (ref 80.0–100.0)
Platelets: 348 10*3/uL (ref 150–400)
RBC: 3.76 MIL/uL — ABNORMAL LOW (ref 4.22–5.81)
RDW: 14.8 % (ref 11.5–15.5)
WBC: 8.4 10*3/uL (ref 4.0–10.5)
nRBC: 0 % (ref 0.0–0.2)

## 2018-06-28 LAB — BASIC METABOLIC PANEL
Anion gap: 10 (ref 5–15)
BUN: 16 mg/dL (ref 8–23)
CO2: 26 mmol/L (ref 22–32)
Calcium: 8.2 mg/dL — ABNORMAL LOW (ref 8.9–10.3)
Chloride: 102 mmol/L (ref 98–111)
Creatinine, Ser: 0.92 mg/dL (ref 0.61–1.24)
GFR calc Af Amer: >60 mL/min (ref >60)
GFR calc non Af Amer: >60 mL/min (ref >60)
GLUCOSE: 115 mg/dL — AB (ref 70–99)
Potassium: 3.9 mmol/L (ref 3.5–5.1)
Sodium: 138 mmol/L (ref 135–145)

## 2018-06-28 LAB — BRAIN NATRIURETIC PEPTIDE: B Natriuretic Peptide: 145 pg/mL — ABNORMAL HIGH (ref 0.0–100.0)

## 2018-06-28 LAB — TROPONIN I: Troponin I: <0.03 ng/mL (ref <0.03)

## 2018-06-28 MED ORDER — FUROSEMIDE 10 MG/ML IJ SOLN: 20.0000 mg | Freq: Once | INTRAMUSCULAR | Status: DC

## 2018-06-28 NOTE — ED Provider Notes (Signed)
[facility] Emergency Department Provider Note    First MD Initiated Contact with Patient 06/28/18 2007     (approximate)  I have reviewed the triage vital signs and the nursing notes.   HISTORY  Chief Complaint Leg Swelling    HPI Evan Sanchez is a 78 y.o. male below listed past medical history presents the ER for evaluation of leg swelling and concern for A. fib.  Patient does have a history ofA. fib on Xarelto  is post very recent ablation.  During the wife they noticed that his legs were swelling over the past day or 2 and they called their physician at the [place] who directed her to the ER for evaluation given his multiple comorbidities.  He denies any chest pain or shortness of breath.  No orthopnea.  Does have several pound weight gain over the past few days but has increased his Lasix the past day with improvement in the swelling.   Past Medical History:  Diagnosis Date  . Atrial fibrillation ([place])   . CHF (congestive heart failure) ([place])     put on coumadin after had chf 3-4 yrs ago  . Current use of long term anticoagulation    Xarelto as of 11/22/2015  . Depression   . GERD (gastroesophageal reflux disease)    h/o acid reflux  . Heart murmur    as a child  . Hypertension    takes lisinopril - requested ekg, echo, stress tes t from [facility]   Family History  Problem Relation Age of Onset  . Heart failure Father   . Heart attack Father   . Prostate cancer Father   . Pneumonia Mother    Past Surgical History:  Procedure Laterality Date  . ATRIAL FIBRILLATION ABLATION  2018  . CARDIAC DEFIBRILLATOR PLACEMENT  ? 2010  . CARDIAC DEFIBRILLATOR PLACEMENT    . EYE SURGERY     right cataract removal  . INSERT / REPLACE / REMOVE PACEMAKER    . LUMBAR LAMINECTOMY/DECOMPRESSION MICRODISCECTOMY  03/04/2011   Procedure: LUMBAR LAMINECTOMY/DECOMPRESSION MICRODISCECTOMY;  Surgeon: [name];  Location: [place] [facility];  Service:  Neurosurgery;  Laterality: Left;  LEFT Lumbar four-five LUMBAR MICRODISKECTOMY  . VASECTOMY     Patient Active Problem List   Diagnosis Date Noted  . HTN (hypertension) 06/14/2017  . GERD (gastroesophageal reflux disease) 06/14/2017  . CAD (coronary artery disease) 06/14/2017  . Atrial fibrillation, chronic 06/14/2017  . HLD (hyperlipidemia) 06/14/2017  . 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 Authorizing Provider  carvedilol (COREG) 25 MG tablet Take 25 mg by mouth 2 (two) times daily with a meal.      [provider]  citalopram (CELEXA) 20 MG tablet Take 20 mg by mouth daily.      [provider]  digoxin (LANOXIN) 0.125 MG tablet Take 125 mcg by mouth daily.      [provider]  donepezil (ARICEPT) 5 MG tablet Take 5 mg by mouth at bedtime.  04/29/15   [provider]  DULoxetine (CYMBALTA) 60 MG capsule Take 60 mg by mouth at bedtime.     [provider]  furosemide (LASIX) 40 MG tablet Take 40-80 mg by mouth daily. Depending on degree of swelling    [provider]  lisinopril (PRINIVIL,ZESTRIL) 5 MG tablet Take 5 mg by mouth daily.  [provider]  losartan (COZAAR) 50 MG tablet Take 50 mg by mouth daily.  06/18/15   [provider]  nitroGLYCERIN (NITROSTAT) 0.4 MG SL tablet Place 0.4 mg under the tongue every 5 (five) minutes as needed for chest pain.     [provider]  NON FORMULARY Take 2-3 tablets by mouth See admin instructions. Prescription multivitamin filled at the [facility] 3 tablets daily with lunch and 2 tablets daily at bedtime    [provider]  Omega-3 Fatty Acids (FISH OIL) 1200 MG CAPS Take 1,200 mg by mouth daily with lunch.     [provider]  rivaroxaban (XARELTO) 20 MG TABS tablet Take 20 mg by mouth daily with supper.  02/02/15   [provider]  simvastatin (ZOCOR) 40 MG tablet Take 40 mg by mouth at bedtime.      [provider]    Allergies Patient has no known allergies.    Social History Social History   Tobacco Use  . Smoking status: Former Smoker    Packs/day: 2.00    Years: 25.00    Pack years: 50.00    Types: Cigarettes    Last attempt to quit: 02/28/1976    Years since quitting: 42.3  . Smokeless tobacco: Never Used  Substance Use Topics  . Alcohol use: Yes    Alcohol/week: 14.0 standard drinks    Types: 14 Glasses of wine per week  . Drug use: No    Review of Systems Patient denies headaches, rhinorrhea, blurry vision, numbness, shortness of breath, chest pain, edema, cough, abdominal pain, nausea, vomiting, diarrhea, dysuria, fevers, rashes or hallucinations unless otherwise stated above in HPI. ____________________________________________   PHYSICAL EXAM:  VITAL SIGNS: Vitals:   06/28/18 1729  BP: 124/65  Pulse: 83  Resp: 18  Temp: 98.5 F (36.9 C)  SpO2: 95%    Constitutional: Alert and oriented.  Eyes: Conjunctivae are normal.  Head: Atraumatic. Nose: No congestion/rhinnorhea. Mouth/Throat: Mucous membranes are moist.   Neck: No stridor. Painless ROM.  Cardiovascular: Normal rate, regular rhythm. Grossly normal heart sounds.  Good peripheral circulation. Respiratory: Normal respiratory effort.  No retractions. Lungs CTAB. Gastrointestinal: Soft and nontender. No distention. No abdominal bruits. No CVA tenderness. Genitourinary:  Musculoskeletal: No lower extremity tenderness 1+ BLE edema.  No joint effusions. Neurologic:  Normal speech and language. No gross focal neurologic deficits are appreciated. No facial droop Skin:  Skin is warm, dry and intact. No rash noted. Psychiatric: Mood and affect are normal. Speech and behavior are normal.  ____________________________________________   LABS (all labs ordered are listed, but only abnormal results are  displayed)  Results for orders placed or performed during the hospital encounter of 06/28/18 (from the past 24 hour(s))  Basic metabolic panel     Status: Abnormal   Collection Time: 06/28/18  5:35 PM  Result Value Ref Range   Sodium 138 135 - 145 mmol/L   Potassium 3.9 3.5 - 5.1 mmol/L   Chloride 102 98 - 111 mmol/L   CO2 26 22 - 32 mmol/L   Glucose, Bld 115 (H) 70 - 99 mg/dL   BUN 16 8 - 23 mg/dL   Creatinine, Ser 0.92 0.61 - 1.24 mg/dL   Calcium 8.2 (L) 8.9 - 10.3 mg/dL   GFR calc non Af Amer >60 >60 mL/min   GFR calc Af Amer >60 >60 mL/min   Anion gap 10 5 - 15  CBC     Status: Abnormal   Collection  Time: 06/28/18  5:35 PM  Result Value Ref Range   WBC 8.4 4.0 - 10.5 K/uL   RBC 3.76 (L) 4.22 - 5.81 MIL/uL   Hemoglobin 11.7 (L) 13.0 - 17.0 g/dL   HCT 37.0 (L) 39.0 - 52.0 %   MCV 98.4 80.0 - 100.0 fL   MCH 31.1 26.0 - 34.0 pg   MCHC 31.6 30.0 - 36.0 g/dL   RDW 14.8 11.5 - 15.5 %   Platelets 348 150 - 400 K/uL   nRBC 0.0 0.0 - 0.2 %  Troponin I - ONCE - STAT     Status: None   Collection Time: 06/28/18  5:35 PM  Result Value Ref Range   Troponin I <0.03 <0.03 ng/mL  Brain natriuretic peptide     Status: Abnormal   Collection Time: 06/28/18  5:35 PM  Result Value Ref Range   B Natriuretic Peptide 145.0 (H) 0.0 - 100.0 pg/mL   ____________________________________________  EKG My review and personal interpretation at Time: 17:24   Indication: afib  Rate: 80  Rhythm: afib Axis: right  Other: rbbb, nonspecific st abn, no stemi ____________________________________________  RADIOLOGY  I personally reviewed all radiographic images ordered to evaluate for the above acute complaints and reviewed radiology reports and findings.  These findings were personally discussed with the patient.  Please see medical record for radiology report.  ____________________________________________   PROCEDURES  Procedure(s) performed:  Procedures    Critical Care performed:  no ____________________________________________   INITIAL IMPRESSION / ASSESSMENT AND PLAN / ED COURSE  Pertinent labs & imaging results that were available during my care of the patient were reviewed by me and considered in my medical decision making (see chart for details).   DDX: CHF, A. fib, electrolyte abnormality, anasarca,   Evan Sanchez is a 78 y.o. who presents to the ED with symptoms as described above.  Patient currently afebrile hemodynamically stable.  He is in rate controlled A. fib.  He is already anticoagulated and has been increasing his Lasix with some improvement in the edema.  We will check x-ray to ensure there is no sign of congestive heart failure.  Clinical Course as of Jun 27 2104  Thu Jun 28, 2018  2055 Patient reassessed.  He is not hypoxic.  No evidence of edema.  Trope negative.  Blood work is reassuring.  His A. fib is controlled on home medications.  At this point do believe he stable and appropriate for outpatient follow-up.  Have encouraged patient and wife to double his Lasix for the next 2 days and call his cardiologist for appointment as soon as possible.  We discussed signs and symptoms for which he should return to the ER.   [PR]    Clinical Course User Index [PR] Merlyn Lot, MD     As part of my medical decision making, I reviewed the following data within the [place] notes reviewed and incorporated, Labs reviewed, notes from prior ED visits and [place] Controlled Substance Database   ____________________________________________   FINAL CLINICAL IMPRESSION(S) / ED DIAGNOSES  Final diagnoses:  Peripheral edema  Atrial fibrillation, unspecified type ([place])      NEW MEDICATIONS STARTED DURING THIS VISIT:  New Prescriptions   No medications on file     Note:  This document was prepared using Dragon voice recognition software and may include unintentional dictation errors.    Merlyn Lot,  MD 06/28/18 2133

## 2018-06-28 NOTE — ED Triage Notes (Signed)
PT arrives with concerns over leg swelling over the last few days. Pt's pcp concerned over possible fluid retention and sent pt to ED for further evaluation. Pt reports compliance with medication regimen. Pt denies pain in triage. Family also expresses concerns over afib.

## 2018-06-28 NOTE — Discharge Instructions (Signed)
Please double your furosemide for the next 2 days and follow-up with your cardiology physician as soon as possible.  Return for any additional questions or concerns.

## 2019-10-17 IMAGING — CR DG CHEST 2V
2 series · 2 of 2 positions shown · non-contrast
Comparison: Chest x-ray dated 06/14/2017.

CLINICAL DATA: Dizziness, nausea since last night.

EXAM:
CHEST - 2 VIEW

[chest pa]
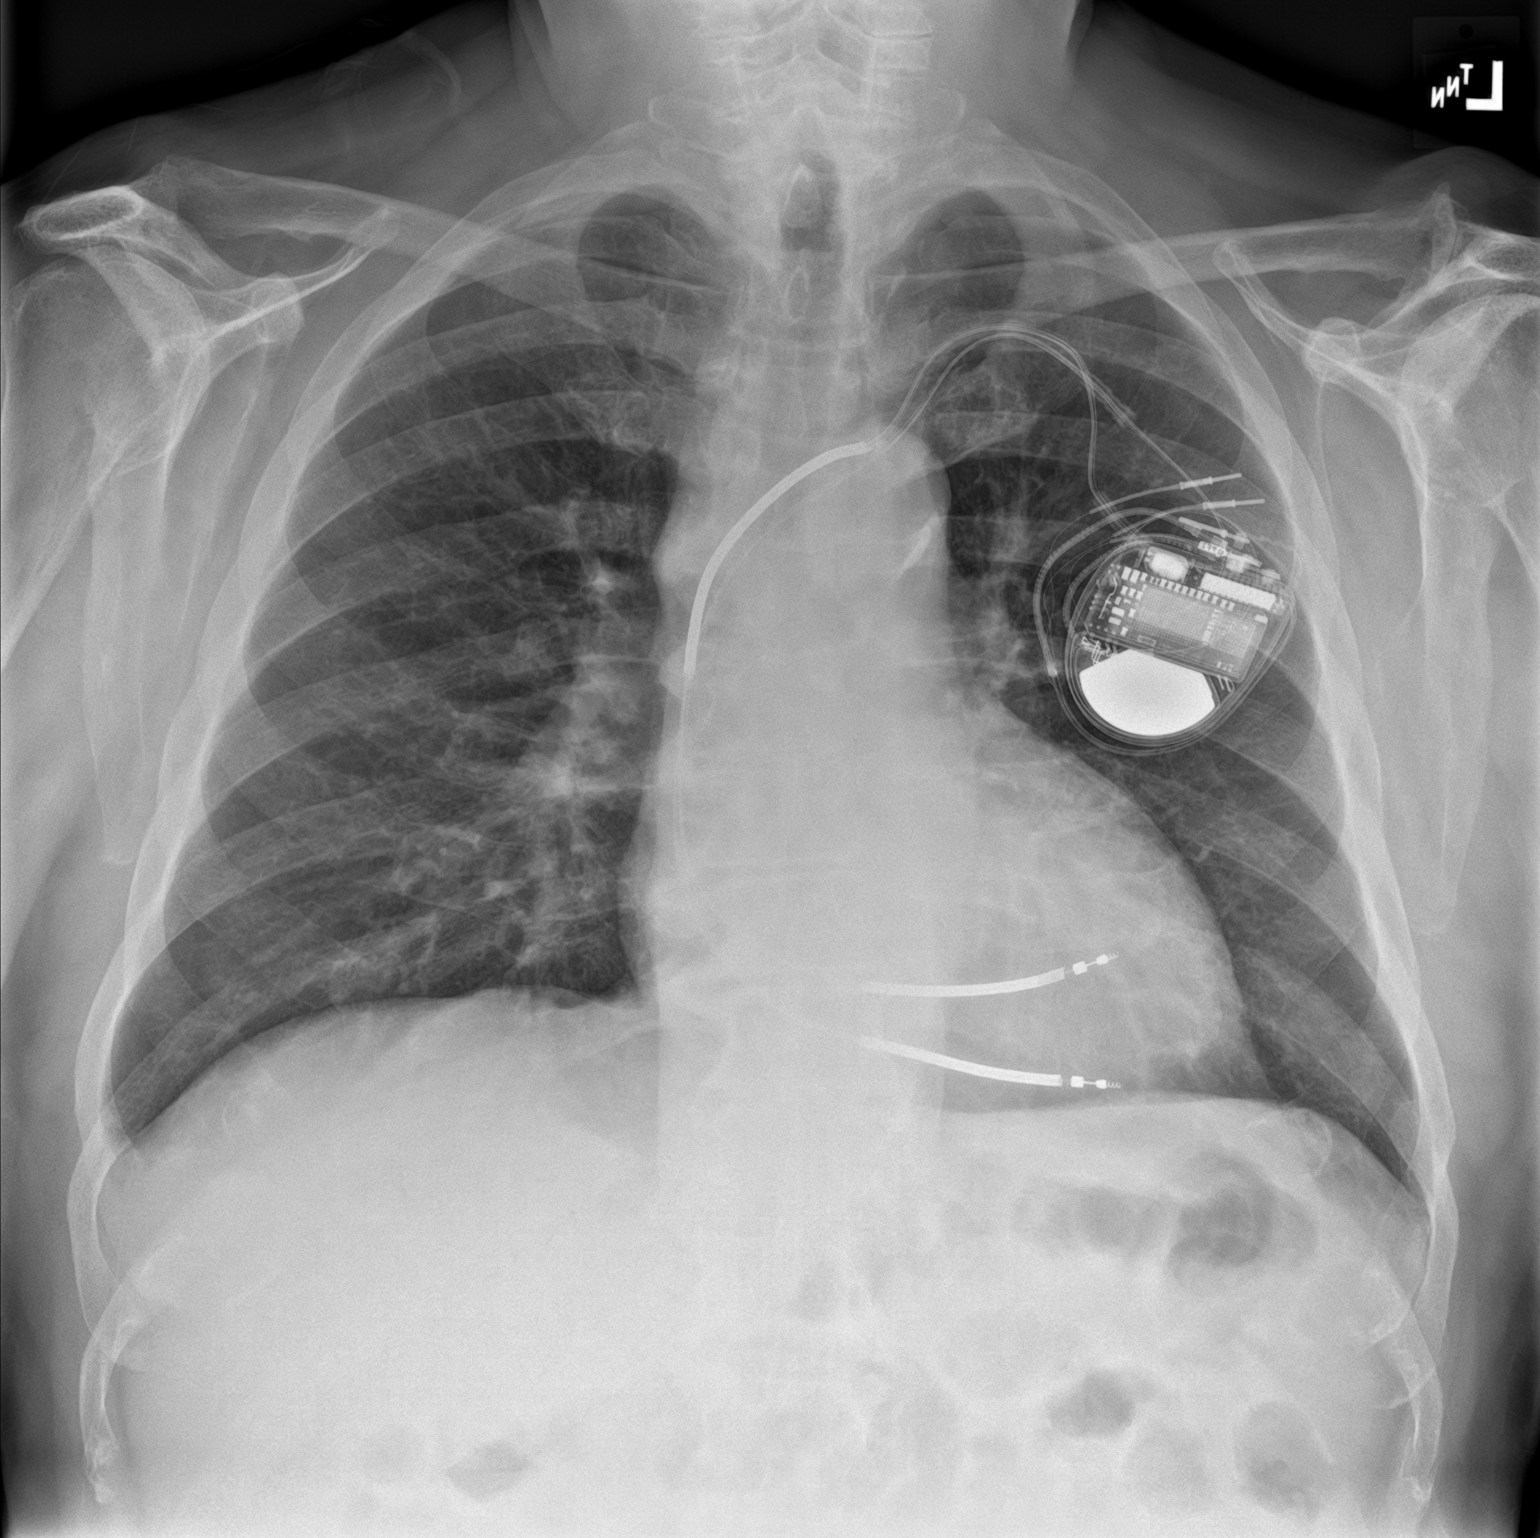

[chest lat]
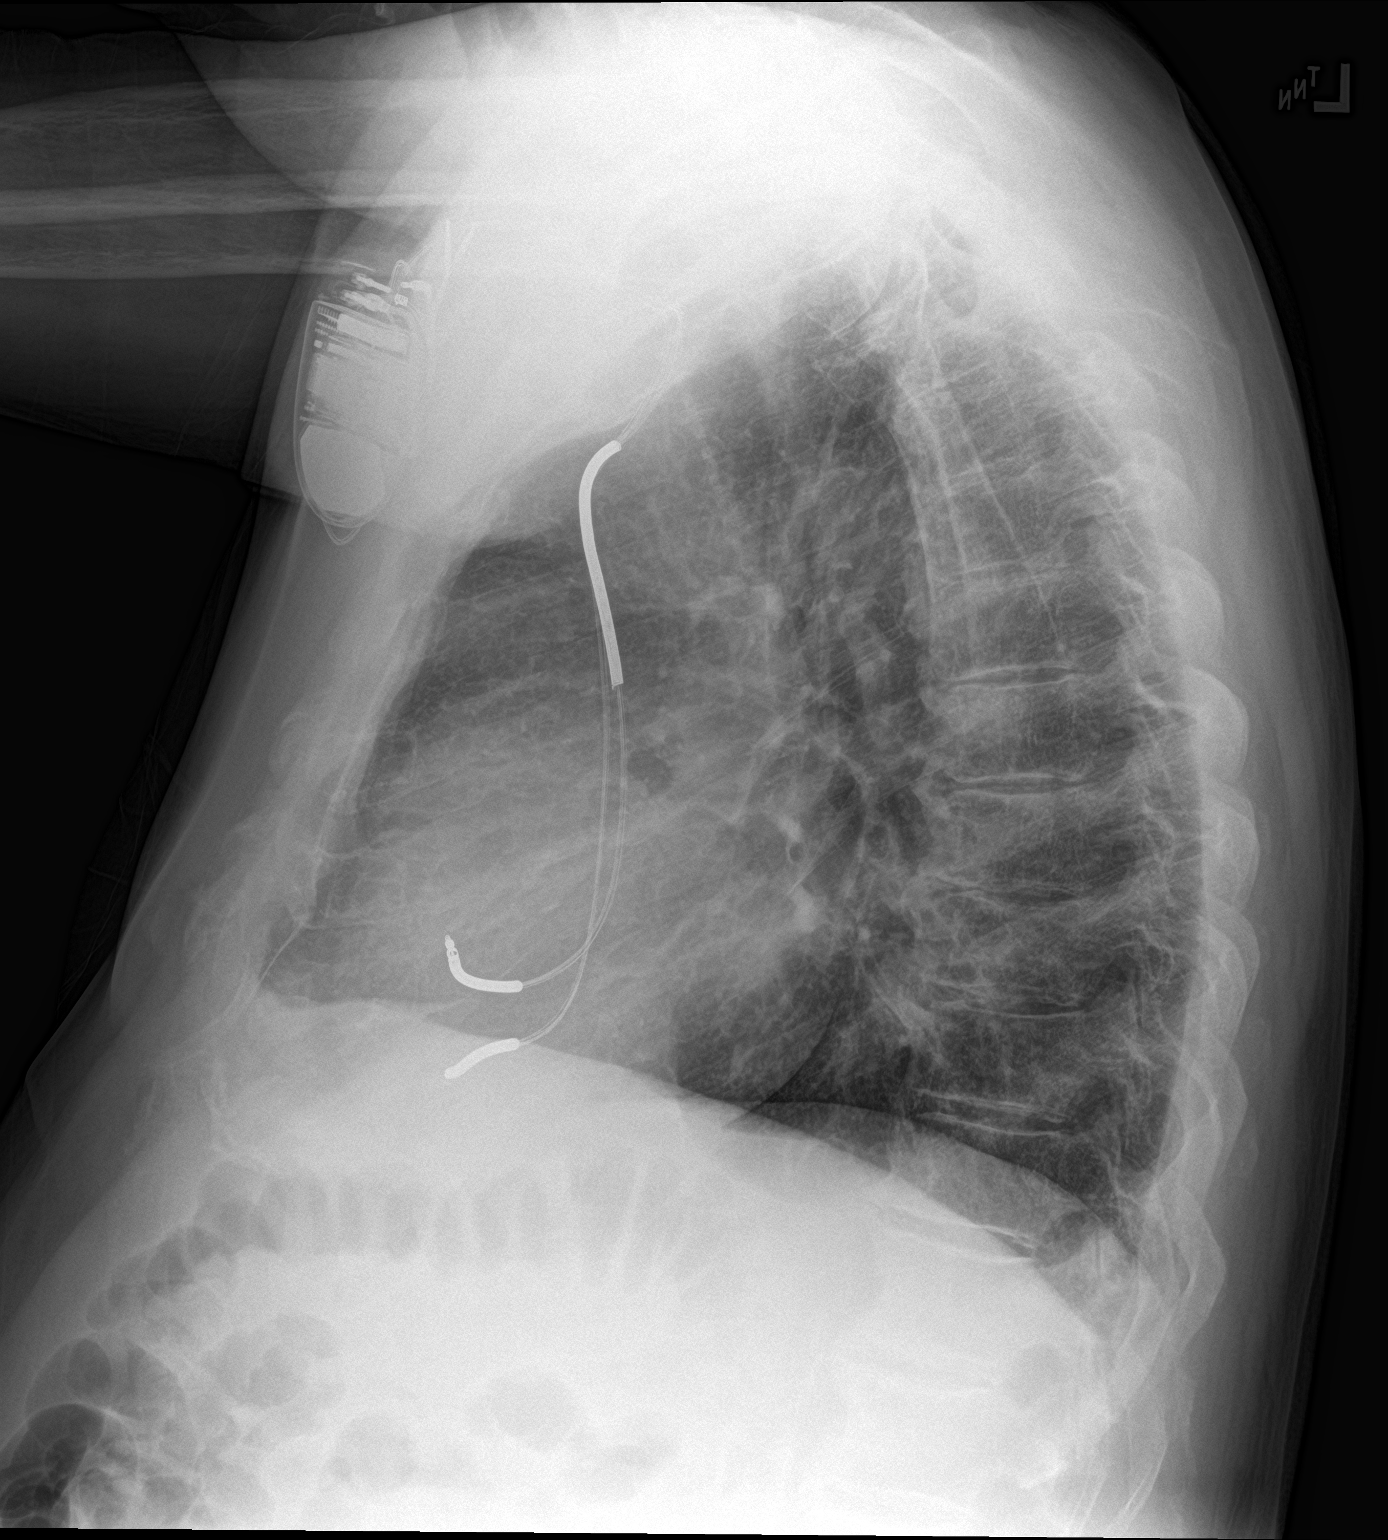

[2 of 2 positions shown; findings below may reference images not displayed]

FINDINGS: Heart size and mediastinal contours are stable. Dual lead
pacemaker/ICD now in place, with both leads overlying the LEFT heart
border.

Lungs are clear. No pleural effusion or pneumothorax seen. No acute
or suspicious osseous finding.
IMPRESSION: No active cardiopulmonary disease. No evidence of pneumonia or
pulmonary edema.

## 2020-04-14 ENCOUNTER — Encounter: Payer: Self-pay | Admitting: Nurse Practitioner

## 2020-04-14 DIAGNOSIS — I10 Essential (primary) hypertension: Secondary | ICD-10-CM | POA: Insufficient documentation

## 2020-04-14 DIAGNOSIS — I509 Heart failure, unspecified: Secondary | ICD-10-CM | POA: Insufficient documentation

## 2020-09-23 DEATH — deceased
